# Patient Record
Sex: Female | Born: 1975 | Race: White | Hispanic: No | Marital: Married | Smoking: Former smoker
Health system: Southern US, Community
[De-identification: ages and names within clinical notes are randomized; demographics above are authoritative.]

## PROBLEM LIST (undated history)

## (undated) DIAGNOSIS — M216X9 Other acquired deformities of unspecified foot: Secondary | ICD-10-CM

## (undated) DIAGNOSIS — M2240 Chondromalacia patellae, unspecified knee: Secondary | ICD-10-CM

## (undated) DIAGNOSIS — F32A Depression, unspecified: Secondary | ICD-10-CM

## (undated) DIAGNOSIS — E039 Hypothyroidism, unspecified: Secondary | ICD-10-CM

## (undated) DIAGNOSIS — M069 Rheumatoid arthritis, unspecified: Secondary | ICD-10-CM

## (undated) DIAGNOSIS — Z1509 Genetic susceptibility to other malignant neoplasm: Secondary | ICD-10-CM

## (undated) HISTORY — DX: Genetic susceptibility to other malignant neoplasm: Z15.09

## (undated) HISTORY — DX: Rheumatoid arthritis, unspecified: M06.9

## (undated) HISTORY — DX: Other acquired deformities of unspecified foot: M21.6X9

## (undated) HISTORY — PX: ABDOMINAL HYSTERECTOMY: SHX81

## (undated) HISTORY — DX: Chondromalacia patellae, unspecified knee: M22.40

## (undated) HISTORY — DX: Hypothyroidism, unspecified: E03.9

## (undated) HISTORY — PX: NO PAST SURGERIES: SHX2092

## (undated) HISTORY — DX: Depression, unspecified: F32.A

---

## 2002-03-14 ENCOUNTER — Other Ambulatory Visit: Admission: RE | Admit: 2002-03-14 | Discharge: 2002-03-14 | Payer: Self-pay | Admitting: Obstetrics and Gynecology

## 2008-09-11 ENCOUNTER — Ambulatory Visit: Payer: Self-pay | Admitting: Sports Medicine

## 2008-09-11 DIAGNOSIS — M2240 Chondromalacia patellae, unspecified knee: Secondary | ICD-10-CM | POA: Insufficient documentation

## 2008-09-11 DIAGNOSIS — M216X9 Other acquired deformities of unspecified foot: Secondary | ICD-10-CM

## 2008-12-25 ENCOUNTER — Ambulatory Visit (HOSPITAL_COMMUNITY): Admission: RE | Admit: 2008-12-25 | Discharge: 2008-12-25 | Payer: Self-pay | Admitting: Obstetrics and Gynecology

## 2009-11-08 ENCOUNTER — Inpatient Hospital Stay (HOSPITAL_COMMUNITY): Admission: AD | Admit: 2009-11-08 | Discharge: 2009-11-09 | Payer: Self-pay | Admitting: Obstetrics and Gynecology

## 2009-11-10 ENCOUNTER — Inpatient Hospital Stay (HOSPITAL_COMMUNITY): Admission: AD | Admit: 2009-11-10 | Discharge: 2009-11-13 | Payer: Self-pay | Admitting: Obstetrics and Gynecology

## 2009-12-28 LAB — HM PAP SMEAR: HM Pap smear: NORMAL

## 2010-06-13 LAB — CBC
Hemoglobin: 12.5 g/dL (ref 12.0–15.0)
MCHC: 34.9 g/dL (ref 30.0–36.0)
MCHC: 33.3 g/dL (ref 30.0–36.0)
MCV: 93.7 fL (ref 78.0–100.0)
Platelets: 140 10*3/uL — ABNORMAL LOW (ref 150–400)
Platelets: 188 10*3/uL (ref 150–400)
RDW: 14.1 % (ref 11.5–15.5)
RDW: 13.9 % (ref 11.5–15.5)
WBC: 10 10*3/uL (ref 4.0–10.5)

## 2010-06-13 LAB — RPR: RPR Ser Ql: NONREACTIVE

## 2010-09-19 ENCOUNTER — Ambulatory Visit (INDEPENDENT_AMBULATORY_CARE_PROVIDER_SITE_OTHER): Payer: PRIVATE HEALTH INSURANCE | Admitting: Internal Medicine

## 2010-09-19 ENCOUNTER — Encounter: Payer: Self-pay | Admitting: Internal Medicine

## 2010-09-19 ENCOUNTER — Other Ambulatory Visit (INDEPENDENT_AMBULATORY_CARE_PROVIDER_SITE_OTHER): Payer: PRIVATE HEALTH INSURANCE

## 2010-09-19 ENCOUNTER — Other Ambulatory Visit: Payer: Self-pay | Admitting: Internal Medicine

## 2010-09-19 VITALS — BP 102/70 | HR 53 | Temp 98.6°F | Ht 68.0 in | Wt 164.6 lb

## 2010-09-19 DIAGNOSIS — R1032 Left lower quadrant pain: Secondary | ICD-10-CM

## 2010-09-19 DIAGNOSIS — Z0000 Encounter for general adult medical examination without abnormal findings: Secondary | ICD-10-CM

## 2010-09-19 DIAGNOSIS — E039 Hypothyroidism, unspecified: Secondary | ICD-10-CM

## 2010-09-19 LAB — URINALYSIS
Hgb urine dipstick: NEGATIVE
Ketones, ur: NEGATIVE
Leukocytes, UA: NEGATIVE
Specific Gravity, Urine: 1.015 (ref 1.000–1.030)
Urine Glucose: NEGATIVE
Urobilinogen, UA: 0.2 (ref 0.0–1.0)

## 2010-09-19 LAB — BASIC METABOLIC PANEL
BUN: 11 mg/dL (ref 6–23)
CO2: 25 mEq/L (ref 19–32)
Calcium: 9.1 mg/dL (ref 8.4–10.5)
Chloride: 107 mEq/L (ref 96–112)
Creatinine, Ser: 0.5 mg/dL (ref 0.4–1.2)
Glucose, Bld: 98 mg/dL (ref 70–99)

## 2010-09-19 LAB — LIPID PANEL
LDL Cholesterol: 105 mg/dL — ABNORMAL HIGH (ref 0–99)
Total CHOL/HDL Ratio: 3
Triglycerides: 35 mg/dL (ref 0.0–149.0)

## 2010-09-19 LAB — HEPATIC FUNCTION PANEL
AST: 33 U/L (ref 0–37)
Alkaline Phosphatase: 84 U/L (ref 39–117)
Bilirubin, Direct: 0.6 mg/dL — ABNORMAL HIGH (ref 0.0–0.3)
Total Bilirubin: 1.6 mg/dL — ABNORMAL HIGH (ref 0.3–1.2)

## 2010-09-19 LAB — CBC WITH DIFFERENTIAL/PLATELET
Basophils Absolute: 0 10*3/uL (ref 0.0–0.1)
Basophils Relative: 0.5 % (ref 0.0–3.0)
Eosinophils Absolute: 0.1 10*3/uL (ref 0.0–0.7)
Lymphocytes Relative: 49.8 % — ABNORMAL HIGH (ref 12.0–46.0)
MCHC: 34.5 g/dL (ref 30.0–36.0)
MCV: 90.4 fl (ref 78.0–100.0)
Monocytes Absolute: 0.4 10*3/uL (ref 0.1–1.0)
Neutrophils Relative %: 40.5 % — ABNORMAL LOW (ref 43.0–77.0)
RDW: 14.1 % (ref 11.5–14.6)

## 2010-09-19 NOTE — Patient Instructions (Signed)
It was good to see you today. We have reviewed your prior records today Test(s) ordered today. Your results will be called to you after review (48-72hours after test completion). If any changes need to be made, you will be notified at that time. IF unremarkable labs, will plan [place] to evaluate LLQ pain Please schedule followup in 6 months to follow thyroid, call sooner if problems.

## 2010-09-19 NOTE — Assessment & Plan Note (Signed)
The current medical regimen is effective;  continue present plan and medications. No results found for this basename: TSH

## 2010-09-19 NOTE — Progress Notes (Signed)
Subjective:    Patient ID: Jennifer Beasley, female    DOB: 05/21/1975, 35 y.o.   MRN: [id number]  HPI Here to est care - new to me and our practice - patient is here today for annual physical. Patient feels well today  complains of LLQ pain symptoms intermittent Ongoing >2 mo - occurs most days of week but not daily Not related to bowel movement, activity, position, food Not associated with fever, change bowels, travel or menses No nausea and vomiting  Eval by gyn for same - "no cyst" or gyn problem identified on pelvic exam Also s/p empiric tx macrobid x 7 days for ?UTI - not change pain symptoms   Also reviewed  Chronic med issues: Hypothyroid - dx mid 30s prepregnancy. the patient reports compliance with medication(s) as prescribed. Denies adverse side effects. No unintentional weight changes  Past Medical History  Diagnosis Date  . 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 History of chicken pox   . UTI (urinary tract infection)   . Unspecified hypothyroidism    Family History  Problem Relation Age of Onset  . Hyperlipidemia Mother   . Stroke Mother 22    A fib  . Atrial fibrillation Mother   . Diabetes Mother   . Hypothyroidism Mother   . Ovarian cancer Maternal Aunt   . Breast cancer Maternal Aunt   . Cancer Maternal Aunt 63    breast  . Breast cancer Maternal Grandmother   . Cancer Maternal Grandmother 4    breast  . Prostate cancer Maternal Grandfather   . Alcohol abuse Other     other relative  . Diabetes Other     parent, and paternal grandparents   History  Substance Use Topics  . Smoking status: Former Games developer  . Smokeless tobacco: Not on file  . Alcohol Use: Yes    Review of Systems  Constitutional: Negative for fever.  Respiratory: Negative for cough and shortness of breath.   Cardiovascular: Negative for chest pain.  Gastrointestinal: See HPI above - otherwise negative.  Musculoskeletal:  Negative for gait problem.  Skin: Negative for rash.  Neurological: Negative for dizziness.  No other specific complaints in a complete review of systems (except as listed in HPI above).     Objective:   Physical Exam BP 102/70  Pulse 53  Temp(Src) 98.6 F (37 C) (Oral)  Ht 5\' 8"  (1.727 m)  Wt 164 lb 9.6 oz (74.662 kg)  BMI 25.03 kg/m2  SpO2 99% Physical Exam  Constitutional: She is oriented to person, place, and time. She appears well-developed and well-nourished. No distress.  HENT: Head: Normocephalic and atraumatic. Ears; B TMs ok, no erythema or effusion; Nose: Nose normal.  Mouth/Throat: Oropharynx is clear and moist. No oropharyngeal exudate.  Eyes: Conjunctivae and EOM are normal. Pupils are equal, round, and reactive to light. No scleral icterus.  Neck: Normal range of motion. Neck supple. No JVD present. No thyromegaly present.  Cardiovascular: Normal rate, regular rhythm and normal heart sounds.  No murmur heard. No BLE edema. Pulmonary/Chest: Effort normal and breath sounds normal. No respiratory distress. She has no wheezes.  Abdominal: Soft. Bowel sounds are normal. She exhibits no distension. There is no tenderness.  Musculoskeletal: Normal range of motion, no joint effusions. No gross deformities Neurological: She is alert and oriented to person, place, and time. No cranial nerve deficit. Coordination normal.  Skin: Skin is warm and dry. No rash noted. No erythema.  Psychiatric: She has a normal mood and affect. Her behavior is normal. Judgment and thought content normal.   Lab Results  Component Value Date   WBC 10.0 11/12/2009   HGB 10.2 DELTA CHECK NOTED REPEATED TO VERIFY* 11/12/2009   HCT 29.1* 11/12/2009   PLT 140 DELTA CHECK NOTED SPECIMEN CHECKED FOR CLOTS REPEATED TO VERIFY* 11/12/2009       Assessment & Plan:  CPX - v70.0 - Patient has been counseled on age-appropriate routine health concerns for screening and prevention. These are reviewed and  up-to-date. Immunizations are up-to-date or declined. Labs order and reviewed.  LLQ pain - intermittent but ongoing > 2 mo - review labs as for CPX and pursue [place] if no other abn found on labs

## 2010-09-24 ENCOUNTER — Ambulatory Visit
Admission: RE | Admit: 2010-09-24 | Discharge: 2010-09-24 | Disposition: A | Discharge status: Home / Self Care | Payer: PRIVATE HEALTH INSURANCE | Source: Ambulatory Visit | Attending: Internal Medicine | Admitting: Internal Medicine

## 2010-09-24 DIAGNOSIS — R1032 Left lower quadrant pain: Secondary | ICD-10-CM

## 2010-09-25 ENCOUNTER — Encounter: Payer: Self-pay | Admitting: *Deleted

## 2011-03-31 NOTE — L&D Delivery Note (Signed)
Delivery Note  C/C/+2 at 0032 Onset of active 2nd stage at 0035 Pushing in R lateral position FHR category 1 in 2nd stage  At 12:52 AM a viable female was delivered via Vaginal, Spontaneous Delivery (Presentation: Right Occiput Anterior).  APGAR: 8, 9; weight pending .   Placenta status: Intact, Spontaneous, for disposal.  Cord: 3 vessels with the following complications: None.  Cord clamped after pulsing cessation, cut by FOB. Cord pH: not done  Anesthesia: Epidural  Episiotomy: None Lacerations: 1st degree Suture Repair: 3.0 vicryl rapide Est. Blood Loss (mL): 200  Mom to postpartum.  Baby to mother's arms for bonding.  Jennifer Beasley 11/04/2011, 1:20 AM

## 2011-04-07 LAB — OB RESULTS CONSOLE ABO/RH

## 2011-04-07 LAB — OB RESULTS CONSOLE HIV ANTIBODY (ROUTINE TESTING): HIV: NONREACTIVE

## 2011-04-07 LAB — OB RESULTS CONSOLE RPR: RPR: NONREACTIVE

## 2011-04-07 LAB — OB RESULTS CONSOLE RUBELLA ANTIBODY, IGM: Rubella: IMMUNE

## 2011-10-15 LAB — OB RESULTS CONSOLE GBS: GBS: NEGATIVE

## 2011-11-03 ENCOUNTER — Inpatient Hospital Stay (HOSPITAL_COMMUNITY)
Admission: AD | Admit: 2011-11-03 | Discharge: 2011-11-06 | DRG: 775 | Disposition: A | Discharge status: Home / Self Care | Payer: PRIVATE HEALTH INSURANCE | Source: Ambulatory Visit | Attending: Obstetrics & Gynecology | Admitting: Obstetrics & Gynecology

## 2011-11-03 ENCOUNTER — Encounter (HOSPITAL_COMMUNITY): Payer: Self-pay | Admitting: *Deleted

## 2011-11-03 DIAGNOSIS — M216X9 Other acquired deformities of unspecified foot: Secondary | ICD-10-CM

## 2011-11-03 DIAGNOSIS — E039 Hypothyroidism, unspecified: Secondary | ICD-10-CM | POA: Diagnosis not present

## 2011-11-03 DIAGNOSIS — E079 Disorder of thyroid, unspecified: Secondary | ICD-10-CM | POA: Diagnosis not present

## 2011-11-03 DIAGNOSIS — O09529 Supervision of elderly multigravida, unspecified trimester: Secondary | ICD-10-CM | POA: Diagnosis present

## 2011-11-03 DIAGNOSIS — IMO0001 Reserved for inherently not codable concepts without codable children: Secondary | ICD-10-CM

## 2011-11-03 DIAGNOSIS — M2240 Chondromalacia patellae, unspecified knee: Secondary | ICD-10-CM

## 2011-11-03 LAB — CBC
HCT: 34.5 % — ABNORMAL LOW (ref 36.0–46.0)
Hemoglobin: 11.8 g/dL — ABNORMAL LOW (ref 12.0–15.0)
MCHC: 34.2 g/dL (ref 30.0–36.0)

## 2011-11-03 MED ORDER — LACTATED RINGERS IV SOLN: 500.0000 mL | INTRAVENOUS | Status: DC | PRN

## 2011-11-03 MED ORDER — DIPHENHYDRAMINE HCL 50 MG/ML IJ SOLN: 12.5000 mg | INTRAMUSCULAR | Status: DC | PRN

## 2011-11-03 MED ORDER — PHENYLEPHRINE 40 MCG/ML (10ML) SYRINGE FOR IV PUSH (FOR BLOOD PRESSURE SUPPORT): 80.0000 ug | PREFILLED_SYRINGE | INTRAVENOUS | Status: DC | PRN

## 2011-11-03 MED ORDER — FLEET ENEMA 7-19 GM/118ML RE ENEM: 1.0000 | ENEMA | RECTAL | Status: DC | PRN

## 2011-11-03 MED ORDER — OXYTOCIN 40 UNITS IN LACTATED RINGERS INFUSION - SIMPLE MED
62.5000 mL/h | Freq: Once | INTRAVENOUS | Status: AC
  Administered 2011-11-04: 62.5 mL/h via INTRAVENOUS
  Filled 2011-11-03: qty 1000

## 2011-11-03 MED ORDER — OXYTOCIN BOLUS FROM INFUSION
250.0000 mL | Freq: Once | INTRAVENOUS | Status: AC
  Administered 2011-11-04: 250 mL via INTRAVENOUS
  Filled 2011-11-03: qty 500

## 2011-11-03 MED ORDER — LACTATED RINGERS IV SOLN
500.0000 mL | Freq: Once | INTRAVENOUS | Status: AC
  Administered 2011-11-03: 500 mL via INTRAVENOUS

## 2011-11-03 MED ORDER — LIDOCAINE HCL (PF) 1 % IJ SOLN
30.0000 mL | INTRAMUSCULAR | Status: DC | PRN
  Filled 2011-11-03: qty 30

## 2011-11-03 MED ORDER — ACETAMINOPHEN 325 MG PO TABS: 650.0000 mg | ORAL_TABLET | ORAL | Status: DC | PRN

## 2011-11-03 MED ORDER — OXYTOCIN 10 UNIT/ML IJ SOLN: 10.0000 [IU] | Freq: Once | INTRAMUSCULAR | Status: DC

## 2011-11-03 MED ORDER — EPHEDRINE 5 MG/ML INJ
10.0000 mg | INTRAVENOUS | Status: DC | PRN
Start: 2011-11-03 — End: 2011-11-04

## 2011-11-03 MED ORDER — IBUPROFEN 600 MG PO TABS: 600.0000 mg | ORAL_TABLET | Freq: Four times a day (QID) | ORAL | Status: DC | PRN

## 2011-11-03 MED ORDER — ONDANSETRON HCL 4 MG/2ML IJ SOLN
4.0000 mg | Freq: Four times a day (QID) | INTRAMUSCULAR | Status: DC | PRN
  Administered 2011-11-04: 4 mg via INTRAVENOUS
  Filled 2011-11-03: qty 2

## 2011-11-03 MED ORDER — PHENYLEPHRINE 40 MCG/ML (10ML) SYRINGE FOR IV PUSH (FOR BLOOD PRESSURE SUPPORT)
80.0000 ug | PREFILLED_SYRINGE | INTRAVENOUS | Status: DC | PRN
Start: 2011-11-03 — End: 2011-11-04
  Filled 2011-11-03: qty 5

## 2011-11-03 MED ORDER — EPHEDRINE 5 MG/ML INJ
10.0000 mg | INTRAVENOUS | Status: DC | PRN
Start: 2011-11-03 — End: 2011-11-04
  Filled 2011-11-03: qty 4

## 2011-11-03 MED ORDER — CITRIC ACID-SODIUM CITRATE 334-500 MG/5ML PO SOLN: 30.0000 mL | ORAL | Status: DC | PRN

## 2011-11-03 MED ORDER — OXYCODONE-ACETAMINOPHEN 5-325 MG PO TABS: 1.0000 | ORAL_TABLET | ORAL | Status: DC | PRN

## 2011-11-03 MED ORDER — FENTANYL 2.5 MCG/ML BUPIVACAINE 1/10 % EPIDURAL INFUSION (WH - ANES)
14.0000 mL/h | INTRAMUSCULAR | Status: DC
  Administered 2011-11-04: 14 mL/h via EPIDURAL
  Filled 2011-11-03: qty 60

## 2011-11-03 NOTE — MAU Note (Signed)
Pt states, " I am in labor, and contractions are eery three min."

## 2011-11-03 NOTE — MAU Note (Signed)
Arlan Organ CNM at bedside

## 2011-11-03 NOTE — H&P (Signed)
Jennifer Beasley is a 36 y.o., G2P1001 at [redacted]w[redacted]d female presenting for regular strong ctx. . Maternal Medical History:  Reason for admission: Reason for admission: contractions.  Patient seen in office this AM, was 3/80/-2 with irregular contractions. Has continued to contract at home since, increased frequency and strength for past 3 hours. Reports light spotting / mucus DC since this AM. Denies LOF.  Contractions: Frequency: regular.   Duration is approximately 60 seconds.   Perceived severity is moderate.    Fetal activity: Perceived fetal activity is normal.   Last perceived fetal movement was within the past hour.    Prenatal complications: no prenatal complications Hypothyroidism well controlled on synthroid.     OB History    Grav Para Term Preterm Abortions TAB SAB Ect Mult Living                 Past Medical History  Diagnosis Date  . 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 History of chicken pox   . UTI (urinary tract infection)   . Unspecified hypothyroidism    Past Surgical History  Procedure Date  . No past surgeries    Family History: family history includes Alcohol abuse in her other; Atrial fibrillation in her mother; Breast cancer in her maternal aunt and maternal grandmother; Cancer (age of onset:42) in her maternal grandmother; Cancer (age of onset:63) in her maternal aunt; Diabetes in her mother and other; Hyperlipidemia in her mother; Hypothyroidism in her mother; Ovarian cancer in her maternal aunt; Prostate cancer in her maternal grandfather; and Stroke (age of onset:47) in her mother. Social History:  reports that she has quit smoking. She does not have any smokeless tobacco history on file. She reports that she drinks alcohol. She reports that she does not use illicit drugs.   Prenatal Transfer Tool  Maternal Diabetes: No Genetic Screening: Normal Maternal Ultrasounds/Referrals: Normal Fetal  Ultrasounds or other Referrals:  None Maternal Substance Abuse:  No Significant Maternal Medications:  Meds include: Syntroid Significant Maternal Lab Results:  Lab values include: Group B Strep negative Other Comments:  None  Review of Systems  Constitutional:       Negative. Earlier nausea resolved w/ Zofran x 1 dose.      There were no vitals taken for this visit. Maternal Exam:  Uterine Assessment: Contraction strength is moderate.  Contraction frequency is regular.   Abdomen: Fundal height is S=D.   Estimated fetal weight is 7-7.5.   Fetal presentation: vertex  Introitus: Normal vulva. Normal vagina.  Pelvis: adequate for delivery.   Cervix: Cervix evaluated by digital exam.   7/90/0 BBOW    Fetal Exam Fetal Monitor Review: Mode: ultrasound.   Baseline rate: 130.  Variability: moderate (6-25 bpm).   Pattern: accelerations present and no decelerations.    Fetal State Assessment: Category I - tracings are normal.     Physical Exam  Nursing note and vitals reviewed. Constitutional: She is oriented to person, place, and time. She appears well-developed and well-nourished.  HENT:  Head: Normocephalic.  Eyes: Pupils are equal, round, and reactive to light.  Neck: Normal range of motion. Neck supple.  Cardiovascular: Normal rate and regular rhythm.   Respiratory: Effort normal and breath sounds normal.  GI: Soft. Bowel sounds are normal. There is no tenderness.  Genitourinary: Vagina normal.  Musculoskeletal: Normal range of motion.  Neurological: She is alert and oriented to person, place, and time. She has normal reflexes.  Skin:  Skin is warm and dry.  Psychiatric: She has a normal mood and affect.    Prenatal labs: ABO, Rh:  A  pos Antibody:  neg Rubella:  immune RPR:   NR HBsAg:   NR HIV:   neg GBS:   neg 3GTT: wnl [place]: nl anatomy Genetic screens wnl TSH 1.56 at 28 wks  Assessment/Plan: IUP at term, spontaneous labor Hypothyroid well controlled  on synthroid GBS neg  Admit to [facility] Expectant management Epidural now Plan AROM PRN  Consult Dr. Reyne Dumas 11/03/2011, 11:06 PM

## 2011-11-04 ENCOUNTER — Encounter (HOSPITAL_COMMUNITY): Payer: Self-pay

## 2011-11-04 ENCOUNTER — Encounter (HOSPITAL_COMMUNITY): Payer: Self-pay | Admitting: Anesthesiology

## 2011-11-04 ENCOUNTER — Inpatient Hospital Stay (HOSPITAL_COMMUNITY): Payer: PRIVATE HEALTH INSURANCE | Admitting: Anesthesiology

## 2011-11-04 LAB — CBC
Hemoglobin: 11.8 g/dL — ABNORMAL LOW (ref 12.0–15.0)
MCH: 30.5 pg (ref 26.0–34.0)
Platelets: 179 10*3/uL (ref 150–400)
RBC: 3.87 MIL/uL (ref 3.87–5.11)
WBC: 10.5 10*3/uL (ref 4.0–10.5)

## 2011-11-04 MED ORDER — LEVOTHYROXINE SODIUM 50 MCG PO TABS
50.0000 ug | ORAL_TABLET | Freq: Every day | ORAL | Status: DC
  Administered 2011-11-04 – 2011-11-06 (×3): 50 ug via ORAL
  Filled 2011-11-04 (×3): qty 1

## 2011-11-04 MED ORDER — ONDANSETRON HCL 4 MG/2ML IJ SOLN: 4.0000 mg | INTRAMUSCULAR | Status: DC | PRN

## 2011-11-04 MED ORDER — OXYCODONE-ACETAMINOPHEN 5-325 MG PO TABS: 1.0000 | ORAL_TABLET | ORAL | Status: DC | PRN

## 2011-11-04 MED ORDER — ONDANSETRON HCL 4 MG PO TABS: 4.0000 mg | ORAL_TABLET | ORAL | Status: DC | PRN

## 2011-11-04 MED ORDER — LANOLIN HYDROUS EX OINT: TOPICAL_OINTMENT | CUTANEOUS | Status: DC | PRN

## 2011-11-04 MED ORDER — IBUPROFEN 600 MG PO TABS
600.0000 mg | ORAL_TABLET | Freq: Four times a day (QID) | ORAL | Status: DC
  Administered 2011-11-04 – 2011-11-06 (×9): 600 mg via ORAL
  Filled 2011-11-04 (×8): qty 1

## 2011-11-04 MED ORDER — ZOLPIDEM TARTRATE 5 MG PO TABS: 5.0000 mg | ORAL_TABLET | Freq: Every evening | ORAL | Status: DC | PRN

## 2011-11-04 MED ORDER — SIMETHICONE 80 MG PO CHEW
80.0000 mg | CHEWABLE_TABLET | ORAL | Status: DC | PRN
  Administered 2011-11-04: 80 mg via ORAL

## 2011-11-04 MED ORDER — BENZOCAINE-MENTHOL 20-0.5 % EX AERO
1.0000 "application " | INHALATION_SPRAY | CUTANEOUS | Status: DC | PRN
  Administered 2011-11-04: 1 via TOPICAL
  Filled 2011-11-04: qty 56

## 2011-11-04 MED ORDER — WITCH HAZEL-GLYCERIN EX PADS: 1.0000 "application " | MEDICATED_PAD | CUTANEOUS | Status: DC | PRN

## 2011-11-04 MED ORDER — PRENATAL MULTIVITAMIN CH
1.0000 | ORAL_TABLET | Freq: Every day | ORAL | Status: DC
  Administered 2011-11-04 – 2011-11-06 (×3): 1 via ORAL
  Filled 2011-11-04 (×3): qty 1

## 2011-11-04 MED ORDER — DIPHENHYDRAMINE HCL 25 MG PO CAPS: 25.0000 mg | ORAL_CAPSULE | Freq: Four times a day (QID) | ORAL | Status: DC | PRN

## 2011-11-04 MED ORDER — LIDOCAINE HCL (PF) 1 % IJ SOLN
INTRAMUSCULAR | Status: DC | PRN
  Administered 2011-11-04 (×4): 4 mL

## 2011-11-04 MED ORDER — TETANUS-DIPHTH-ACELL PERTUSSIS 5-2.5-18.5 LF-MCG/0.5 IM SUSP: 0.5000 mL | Freq: Once | INTRAMUSCULAR | Status: DC

## 2011-11-04 MED ORDER — DIBUCAINE 1 % RE OINT: 1.0000 "application " | TOPICAL_OINTMENT | RECTAL | Status: DC | PRN

## 2011-11-04 MED ORDER — SENNOSIDES-DOCUSATE SODIUM 8.6-50 MG PO TABS
2.0000 | ORAL_TABLET | Freq: Every day | ORAL | Status: DC
  Administered 2011-11-04 – 2011-11-05 (×2): 2 via ORAL

## 2011-11-04 MED ORDER — FLEET ENEMA 7-19 GM/118ML RE ENEM: 1.0000 | ENEMA | Freq: Every day | RECTAL | Status: DC | PRN

## 2011-11-04 MED ORDER — BISACODYL 10 MG RE SUPP: 10.0000 mg | Freq: Every day | RECTAL | Status: DC | PRN

## 2011-11-04 NOTE — Anesthesia Procedure Notes (Signed)
Epidural Patient location during procedure: OB Start time: 11/04/2011 12:07 AM Reason for block: procedure for pain  Staffing Performed by: anesthesiologist   Preanesthetic Checklist Completed: patient identified, site marked, surgical consent, pre-op evaluation, timeout performed, IV checked, risks and benefits discussed and monitors and equipment checked  Epidural Patient position: sitting Prep: site prepped and draped and DuraPrep Patient monitoring: continuous pulse ox and blood pressure Approach: midline Injection technique: LOR air  Needle:  Needle type: Tuohy  Needle gauge: 17 G Needle length: 9 cm Needle insertion depth: 6 cm Catheter type: closed end flexible Catheter size: 19 Gauge Catheter at skin depth: 11 cm Test dose: negative  Assessment Events: blood not aspirated, injection not painful, no injection resistance, negative IV test and no paresthesia  Additional Notes Discussed risk of headache, infection, bleeding, nerve injury and failed or incomplete block.  Patient voices understanding and wishes to proceed.

## 2011-11-04 NOTE — Anesthesia Preprocedure Evaluation (Signed)
Anesthesia Evaluation  Patient identified by MRN, date of birth, ID band Patient awake    Reviewed: Allergy & Precautions, H&P , NPO status , Patient's Chart, lab work & pertinent test results, reviewed documented beta blocker date and time   History of Anesthesia Complications Negative for: history of anesthetic complications  Airway Mallampati: I TM Distance: >3 FB Neck ROM: full    Dental  (+) Teeth Intact   Pulmonary neg pulmonary ROS,  breath sounds clear to auscultation        Cardiovascular negative cardio ROS  Rhythm:regular Rate:Normal     Neuro/Psych negative neurological ROS  negative psych ROS   GI/Hepatic negative GI ROS, Neg liver ROS,   Endo/Other  Hypothyroidism   Renal/GU negative Renal ROS     Musculoskeletal   Abdominal   Peds  Hematology negative hematology ROS (+)   Anesthesia Other Findings   Reproductive/Obstetrics (+) Pregnancy                           Anesthesia Physical Anesthesia Plan  ASA: II  Anesthesia Plan: Epidural   Post-op Pain Management:    Induction:   Airway Management Planned:   Additional Equipment:   Intra-op Plan:   Post-operative Plan:   Informed Consent: I have reviewed the patients History and Physical, chart, labs and discussed the procedure including the risks, benefits and alternatives for the proposed anesthesia with the patient or authorized representative who has indicated his/her understanding and acceptance.     Plan Discussed with:   Anesthesia Plan Comments:         Anesthesia Quick Evaluation

## 2011-11-04 NOTE — Progress Notes (Signed)
Jennifer Beasley is a 36 y.o. G2P1001 at [redacted]w[redacted]d by LMP admitted for active labor  Subjective: Comfortable after epidural, reports + rectal pressure w/ ctx.  Objective: Filed Vitals:   11/04/11 0022 11/04/11 0026 11/04/11 0027 11/04/11 0031  BP:  100/63  113/62  Pulse: 57 57 58 60  Temp:      TempSrc:      Resp:    18  Height:      Weight:      SpO2: 100%  100%           FHT:  FHR: 130 bpm, variability: moderate,  accelerations:  Present,  decelerations:  Absent UC:   regular, every 2-3 minutes SVE:   Dilation: 10 Effacement (%): 90 Station: +2 Exam by:: [name]  Labs:   Basename 11/03/11 2330  WBC 8.5  HGB 11.8*  HCT 34.5*  PLT 192    Assessment / Plan: Spontaneous labor, progressing normally  Labor: Progressing normally Preeclampsia:  NA Fetal Wellbeing:  Category I Pain Control:  Epidural I/D:  GBS neg Anticipated MOD:  NSVD  [name] 11/04/2011, 12:34 AM

## 2011-11-04 NOTE — H&P (Signed)
Reviewed and agree with note. Pt preferring CNM at delivery, will continue to assist as needed.  --V.Juliene Pina, MD

## 2011-11-04 NOTE — Anesthesia Postprocedure Evaluation (Signed)
  Anesthesia Post-op Note  Patient: Jennifer Beasley  Procedure(s) Performed: * No procedures listed *  Patient Location: PACU and Mother/Baby  Anesthesia Type: Epidural  Level of Consciousness: awake, alert  and oriented  Airway and Oxygen Therapy: Patient Spontanous Breathing  Post-op Pain: none  Post-op Assessment: Post-op Vital signs reviewed  Post-op Vital Signs: Reviewed and stable  Complications: No apparent anesthesia complications

## 2011-11-05 NOTE — Progress Notes (Signed)
PPD 1 SVD  S:  Reports feeling well, feels constipated. Perineal pressure.               Tolerating po/ No nausea or vomiting             Bleeding is light             Pain controlled with Motrin             Up ad lib / ambulatory / voiding well   Newborn  Information for the patient's newborn:  Zurii, Hewes [[id number]]  female  breast feeding well   O:  A & O x 3 NAD             VS:  Filed Vitals:   11/04/11 0746 11/04/11 1717 11/04/11 2155 11/05/11 0520  BP: 118/67 105/62 110/71 99/63  Pulse: 52 64 62 60  Temp: 98.1 F (36.7 C) 98.3 F (36.8 C) 98.1 F (36.7 C) 98.5 F (36.9 C)  TempSrc: Oral Oral Oral Oral  Resp: 18 20 18 18   Height:      Weight:      SpO2:    97%    LABS:  Basename 11/04/11 0515 11/03/11 2330  WBC 10.5 8.5  HGB 11.8* 11.8*  HCT 34.3* 34.5*  PLT 179 192    Blood type: A/Positive/-- (01/08 0000)  Rubella: Immune (01/08 0000)   I&O: I/O last 3 completed shifts: In: -  Out: 200 [Blood:200]      Lungs: Clear and unlabored  Heart: regular rate and rhythm / no murmurs  Abdomen: soft, non-tender, non-distended              Fundus: firm, non-tender, U -2  Perineum: repair intact, no edema  Lochia: scant  Extremities: no edema, no calf pain or tenderness, neg Homans    A/P: PPD # 1 36 y.o., [id number]    Principal Problem:  *Postpartum care following vaginal delivery (8/7) Active Problems:  Unspecified hypothyroidism  Continue synthroid as previous  Doing well - stable status  Routine post partum orders  Encouraged sitz bath for perineal pressure  Push oral hydration and fiber, colace daily  Anticipate discharge home in AM.   Yakima Kreitzer, CNM, MSN 11/05/2011, 12:30 PM

## 2011-11-06 NOTE — Discharge Summary (Signed)
Obstetric Discharge Summary Reason for Admission: onset of labor and 38w 1d/Hx hypothyroidism Prenatal Procedures: NST and ultrasound Intrapartum Procedures: spontaneous vaginal delivery Postpartum Procedures: none Complications-Operative and Postpartum: 1st degree perineal laceration Hemoglobin  Date Value Range Status  11/04/2011 11.8* 12.0 - 15.0 g/dL Final     HCT  Date Value Range Status  11/04/2011 34.3* 36.0 - 46.0 % Final    Physical Exam:  General: alert, cooperative and no distress Lochia: appropriate Uterine Fundus: firm Incision: n/a DVT Evaluation: No evidence of DVT seen on physical exam.  Discharge Diagnoses: Term Pregnancy-delivered  Discharge Information: Date: 11/06/2011 Activity: pelvic rest Diet: routine Medications: PNV and Ibuprofen Condition: stable Instructions: refer to practice specific booklet Discharge to: home   Newborn Data: Live born female on 11/04/11 Birth Weight: 7 lb 1.8 oz (3225 g) APGAR: , 9  Home with mother.  Jennifer Beasley 11/06/2011, 9:15 AM

## 2011-11-06 NOTE — Discharge Summary (Signed)
Reviewed and agree with note V.Jenaro Souder, MD 

## 2011-11-06 NOTE — Progress Notes (Signed)
Patient ID: Jennifer Beasley, female   DOB: 05-04-1975, 36 y.o.   MRN: [id number] PPD # 2  Subjective: Pt reports feeling well and eager for d/c home/ Pain controlled with prescription NSAID's including motrin Tolerating po/ Voiding without problems/ No n/v Bleeding is light/ Newborn info:  Information for the patient's newborn:  Haeley, Fordham [[id number]]  female Feeding: breast    Objective:  VS: Blood pressure 111/78, pulse 54, temperature 97.5 F (36.4 C), temperature source Oral, resp. rate 18.    Basename 11/04/11 0515 11/03/11 2330  WBC 10.5 8.5  HGB 11.8* 11.8*  HCT 34.3* 34.5*  PLT 179 192    Blood type: A/Positive/-- (01/08 0000) Rubella: Immune (01/08 0000)    Physical Exam:  General: A & O x 3  alert, cooperative and no distress CV: Regular rate and rhythm Resp: clear Abdomen: soft, nontender, normal bowel sounds Uterine Fundus: firm, below umbilicus, nontender Perineum: not inspected Ext: edema trace and Homans sign is negative, no sign of DVT    A/P: PPD # 2/ G2P2002/ S/P:spontaneous vaginal delivery, with 1st degree repair Doing well and stable for discharge home OTC Ibuprofen 600mg  po Q 6 hrs prn pain #30 Refill x 1 WOB/GYN booklet given Routine pp visit in 6wks   Demetrius Revel, MSN, [facility] 11/06/2011, 8:58 AM

## 2011-11-12 ENCOUNTER — Encounter (HOSPITAL_COMMUNITY): Payer: Self-pay | Admitting: *Deleted

## 2012-09-27 ENCOUNTER — Emergency Department (HOSPITAL_COMMUNITY)
Admission: EM | Admit: 2012-09-27 | Discharge: 2012-09-27 | Disposition: A | Discharge status: Home / Self Care | Payer: PRIVATE HEALTH INSURANCE | Attending: Emergency Medicine | Admitting: Emergency Medicine

## 2012-09-27 ENCOUNTER — Emergency Department (HOSPITAL_COMMUNITY): Payer: PRIVATE HEALTH INSURANCE

## 2012-09-27 DIAGNOSIS — Z87440 Personal history of urinary (tract) infections: Secondary | ICD-10-CM | POA: Insufficient documentation

## 2012-09-27 DIAGNOSIS — Z8619 Personal history of other infectious and parasitic diseases: Secondary | ICD-10-CM | POA: Insufficient documentation

## 2012-09-27 DIAGNOSIS — E039 Hypothyroidism, unspecified: Secondary | ICD-10-CM | POA: Insufficient documentation

## 2012-09-27 DIAGNOSIS — Z8739 Personal history of other diseases of the musculoskeletal system and connective tissue: Secondary | ICD-10-CM | POA: Insufficient documentation

## 2012-09-27 DIAGNOSIS — Z79899 Other long term (current) drug therapy: Secondary | ICD-10-CM | POA: Insufficient documentation

## 2012-09-27 DIAGNOSIS — Z87891 Personal history of nicotine dependence: Secondary | ICD-10-CM | POA: Insufficient documentation

## 2012-09-27 DIAGNOSIS — R109 Unspecified abdominal pain: Secondary | ICD-10-CM | POA: Insufficient documentation

## 2012-09-27 DIAGNOSIS — R630 Anorexia: Secondary | ICD-10-CM | POA: Insufficient documentation

## 2012-09-27 DIAGNOSIS — R110 Nausea: Secondary | ICD-10-CM | POA: Insufficient documentation

## 2012-09-27 LAB — CBC WITH DIFFERENTIAL/PLATELET
Basophils Absolute: 0 10*3/uL (ref 0.0–0.1)
Eosinophils Relative: 1 % (ref 0–5)
HCT: 38.6 % (ref 36.0–46.0)
Hemoglobin: 12.9 g/dL (ref 12.0–15.0)
Lymphocytes Relative: 48 % — ABNORMAL HIGH (ref 12–46)
Lymphs Abs: 3.3 10*3/uL (ref 0.7–4.0)
MCV: 87.7 fL (ref 78.0–100.0)
Monocytes Absolute: 0.4 10*3/uL (ref 0.1–1.0)
Neutro Abs: 3.1 10*3/uL (ref 1.7–7.7)
RBC: 4.4 MIL/uL (ref 3.87–5.11)
WBC: 6.8 10*3/uL (ref 4.0–10.5)

## 2012-09-27 LAB — WET PREP, GENITAL: Clue Cells Wet Prep HPF POC: NONE SEEN

## 2012-09-27 LAB — URINALYSIS, ROUTINE W REFLEX MICROSCOPIC
Bilirubin Urine: NEGATIVE
Ketones, ur: NEGATIVE mg/dL
Leukocytes, UA: NEGATIVE
Nitrite: NEGATIVE
Specific Gravity, Urine: 1.014 (ref 1.005–1.030)
Urobilinogen, UA: 0.2 mg/dL (ref 0.0–1.0)
pH: 5 (ref 5.0–8.0)

## 2012-09-27 MED ORDER — OXYCODONE-ACETAMINOPHEN 5-325 MG PO TABS: 1.0000 | ORAL_TABLET | Freq: Four times a day (QID) | ORAL | Status: DC | PRN

## 2012-09-27 MED ORDER — SODIUM CHLORIDE 0.9 % IV SOLN
Freq: Once | INTRAVENOUS | Status: AC
  Administered 2012-09-27: 22:00:00 via INTRAVENOUS

## 2012-09-27 MED ORDER — IOHEXOL 300 MG/ML  SOLN
50.0000 mL | Freq: Once | INTRAMUSCULAR | Status: AC | PRN
  Administered 2012-09-27: 50 mL via ORAL

## 2012-09-27 MED ORDER — IOHEXOL 300 MG/ML  SOLN
100.0000 mL | Freq: Once | INTRAMUSCULAR | Status: AC | PRN
  Administered 2012-09-27: 100 mL via INTRAVENOUS

## 2012-09-27 MED ORDER — ONDANSETRON HCL 4 MG/2ML IJ SOLN
4.0000 mg | Freq: Once | INTRAMUSCULAR | Status: AC
  Administered 2012-09-27: 4 mg via INTRAVENOUS
  Filled 2012-09-27: qty 2

## 2012-09-27 NOTE — ED Notes (Signed)
Pt has been having RLQ pain x 2 days. Worsening today. Nausea. Denies diarrhea. Has appendix.

## 2012-09-27 NOTE — ED Provider Notes (Signed)
History    CSN: [id number] Arrival date & time 09/27/12  2122  First MD Initiated Contact with Patient 09/27/12 2124     Chief Complaint  Patient presents with  . Abdominal Pain   (Consider location/radiation/quality/duration/timing/severity/associated sxs/prior Treatment) Patient is a 37 y.o. female presenting with abdominal pain. The history is provided by the patient.  Abdominal Pain This is a new problem. The current episode started yesterday. The problem occurs constantly. The problem has been gradually worsening. Associated symptoms include abdominal pain, anorexia and nausea. Pertinent negatives include no chest pain, coughing, fever, urinary symptoms, vomiting or weakness. The symptoms are aggravated by exertion. The treatment provided no relief.   Past Medical History  Diagnosis Date  . 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 History of chicken pox   . UTI (urinary tract infection)   . Unspecified hypothyroidism   . Active labor 11/03/2011  . Postpartum care following vaginal delivery (8/7) 11/04/2011   Past Surgical History  Procedure Laterality Date  . No past surgeries     Family History  Problem Relation Age of Onset  . Hyperlipidemia Mother   . Stroke Mother 48    A fib  . Atrial fibrillation Mother   . Diabetes Mother   . Hypothyroidism Mother   . Ovarian cancer Maternal Aunt   . Breast cancer Maternal Aunt   . Cancer Maternal Aunt 63    breast  . Breast cancer Maternal Grandmother   . Cancer Maternal Grandmother 66    breast  . Prostate cancer Maternal Grandfather   . Alcohol abuse Other     other relative  . Diabetes Other     parent, and paternal grandparents  . Other Neg Hx    History  Substance Use Topics  . Smoking status: Former Smoker    Quit date: 04/19/2011  . Smokeless tobacco: Never Used  . Alcohol Use: Yes   OB History   Grav Para Term Preterm Abortions TAB SAB Ect Mult Living   2 2  2       2      Review of Systems  Constitutional: Negative for fever.  Respiratory: Negative for cough and shortness of breath.   Cardiovascular: Negative for chest pain.  Gastrointestinal: Positive for nausea, abdominal pain and anorexia. Negative for vomiting, diarrhea and constipation.  Genitourinary: Negative for dysuria, flank pain, vaginal bleeding, vaginal discharge, menstrual problem and pelvic pain.  Musculoskeletal: Negative for back pain.  Skin: Negative for wound.  Neurological: Negative for weakness.  All other systems reviewed and are negative.    Allergies  Review of patient's allergies indicates no known allergies.  Home Medications   Current Outpatient Rx  Name  Route  Sig  Dispense  Refill  . levothyroxine (SYNTHROID, LEVOTHROID) 50 MCG tablet   Oral   Take 50 mcg by mouth daily.           . ondansetron (ZOFRAN-ODT) 8 MG disintegrating tablet   Oral   Take 8 mg by mouth every 8 (eight) hours as needed for nausea.         . sertraline (ZOLOFT) 50 MG tablet   Oral   Take 50 mg by mouth daily.         Marland Kitchen oxyCODONE-acetaminophen (PERCOCET/ROXICET) 5-325 MG per tablet   Oral   Take 1-2 tablets by mouth every 6 (six) hours as needed for pain.   30 tablet   0    BP  133/79  Pulse 74  Temp(Src) 98.3 F (36.8 C) (Oral)  SpO2 100% Physical Exam  Constitutional: She appears well-developed and well-nourished.  HENT:  Head: Normocephalic.  Eyes: Pupils are equal, round, and reactive to light.  Neck: Normal range of motion.  Cardiovascular: Normal rate and regular rhythm.   Pulmonary/Chest: Effort normal and breath sounds normal.  Abdominal: Soft. Bowel sounds are normal. She exhibits no distension. There is no hepatosplenomegaly. There is tenderness in the right lower quadrant and suprapubic area. There is no rigidity, no rebound and no guarding.    Genitourinary: Vagina normal. No tenderness around the vagina. No vaginal discharge found.    Lymphadenopathy:       Right: No inguinal adenopathy present.       Left: No inguinal adenopathy present.    ED Course  Procedures (including critical care time) Labs Reviewed  WET PREP, GENITAL - Abnormal; Notable for the following:    WBC, Wet Prep HPF POC MODERATE (*)    All other components within normal limits  CBC WITH DIFFERENTIAL - Abnormal; Notable for the following:    Lymphocytes Relative 48 (*)    All other components within normal limits  POCT I-STAT, CHEM 8 - Abnormal; Notable for the following:    Sodium 134 (*)    Potassium 7.1 (*)    Calcium, Ion 1.01 (*)    All other components within normal limits  URINALYSIS, ROUTINE W REFLEX MICROSCOPIC  POTASSIUM   Ct Abdomen Pelvis W Contrast  09/27/2012   *RADIOLOGY REPORT*  Clinical Data: Quadrant pain for 2 days, worsening.  Nausea.  CT ABDOMEN AND PELVIS WITH CONTRAST  Technique:  Multidetector CT imaging of the abdomen and pelvis was performed following the standard protocol during bolus administration of intravenous contrast.  Contrast:100 ml Omnipaque-300.  Comparison: None.  Findings: Lung bases are clear.  No pleural or pericardial effusion.  The gallbladder, liver, spleen, adrenal glands, pancreas and kidneys appear normal.  The appendix is well visualized and normal in appearance.  The stomach and small and large bowel appear normal.  There is no lymphadenopathy or fluid.  IUD is in place in the uterus.  Adnexa are unremarkable.  No focal bony abnormality is identified.  IMPRESSION: Negative for appendicitis or other acute abnormality.  Negative exam.   Original Report Authenticated By: Holley Dexter, M.D.   1. Abdominal pain     MDM   CT scan, urine, are all within normal limits.  Patient was informed to continue, a prescription for pain control, as she will be traveling for the next month.  She knows to followup as needed  Arman Filter, NP 09/27/12 2308

## 2012-09-27 NOTE — ED Notes (Signed)
PA made aware of i-stat results.

## 2012-09-28 LAB — POCT I-STAT, CHEM 8
Calcium, Ion: 1.01 mmol/L — ABNORMAL LOW (ref 1.12–1.23)
Chloride: 104 mEq/L (ref 96–112)
Glucose, Bld: 86 mg/dL (ref 70–99)
HCT: 41 % (ref 36.0–46.0)
Hemoglobin: 13.9 g/dL (ref 12.0–15.0)
Potassium: 7.1 mEq/L (ref 3.5–5.1)

## 2012-09-28 NOTE — ED Provider Notes (Signed)
Medical screening examination/treatment/procedure(s) were performed by non-physician practitioner and as supervising physician I was immediately available for consultation/collaboration.  Cortez Flippen, MD 09/28/12 0043 

## 2012-11-23 ENCOUNTER — Encounter: Payer: Self-pay | Admitting: Internal Medicine

## 2012-11-23 ENCOUNTER — Ambulatory Visit (INDEPENDENT_AMBULATORY_CARE_PROVIDER_SITE_OTHER): Payer: PRIVATE HEALTH INSURANCE | Admitting: Internal Medicine

## 2012-11-23 VITALS — BP 102/72 | HR 65 | Temp 97.1°F | Ht 68.0 in | Wt 185.4 lb

## 2012-11-23 DIAGNOSIS — F329 Major depressive disorder, single episode, unspecified: Secondary | ICD-10-CM

## 2012-11-23 DIAGNOSIS — F53 Postpartum depression: Secondary | ICD-10-CM | POA: Insufficient documentation

## 2012-11-23 DIAGNOSIS — Z0000 Encounter for general adult medical examination without abnormal findings: Secondary | ICD-10-CM

## 2012-11-23 DIAGNOSIS — E039 Hypothyroidism, unspecified: Secondary | ICD-10-CM

## 2012-11-23 DIAGNOSIS — O99345 Other mental disorders complicating the puerperium: Secondary | ICD-10-CM

## 2012-11-23 MED ORDER — LEVOTHYROXINE SODIUM 75 MCG PO TABS: 75.0000 ug | ORAL_TABLET | Freq: Every day | ORAL | Status: DC

## 2012-11-23 MED ORDER — SERTRALINE HCL 50 MG PO TABS: 50.0000 mg | ORAL_TABLET | Freq: Every day | ORAL | Status: DC

## 2012-11-23 NOTE — Assessment & Plan Note (Signed)
Last dose change spring 2014 reviewed Check now, adjust as needed Lab Results  Component Value Date   TSH 1.89 09/19/2010

## 2012-11-23 NOTE — Progress Notes (Signed)
Subjective:    Patient ID: Jennifer Beasley, female    DOB: Sep 04, 1975, 37 y.o.   MRN: [id number]  HPI  patient is here today for annual physical. Patient feels well today  Reviewed chronic medical issues and interval medical events: Hypothyroidism - status change winter 2014 following second pregnancy -denies side effects to current medications. Denies unexpected weight change, hair change or skin change  Postpartum depression. Began on moderate dose sertraline spring 2014 under direction gynecologist. Symptoms have markedly improved. Denies irritability, tearfulness, dysphoric symptoms - questions plans for eventual wean off same   Past Medical History  Diagnosis Date  . Cavus deformity of foot, acquired   . CHONDROMALACIA PATELLA, LEFT   . Unspecified hypothyroidism    Family History  Problem Relation Age of Onset  . Hyperlipidemia Mother   . Stroke Mother 2    A fib  . Atrial fibrillation Mother   . Diabetes Mother   . Hypothyroidism Mother   . Ovarian cancer Maternal Aunt   . Breast cancer Maternal Aunt 63  . Breast cancer Maternal Grandmother   . Cancer Maternal Grandmother 77    breast  . Prostate cancer Maternal Grandfather   . Alcohol abuse Other     other relatives  . Diabetes Paternal Grandmother    History  Substance Use Topics  . Smoking status: Former Smoker    Quit date: 04/19/2011  . Smokeless tobacco: Never Used  . Alcohol Use: Yes    Review of Systems  Constitutional: Negative for fever.  Respiratory: Negative for cough and shortness of breath.   Cardiovascular: Negative for chest pain.  Gastrointestinal: See HPI above - otherwise negative.  Musculoskeletal: Negative for gait problem.  Skin: Negative for rash.  Neurological: Negative for dizziness.  No other specific complaints in a complete review of systems (except as listed in HPI above).     Objective:   Physical Exam BP 102/72  Pulse 65  Temp(Src) 97.1 F (36.2 C) (Oral)  Ht 5'  8" (1.727 m)  Wt 185 lb 6.4 oz (84.097 kg)  BMI 28.2 kg/m2  SpO2 99% Wt Readings from Last 3 Encounters:  11/23/12 185 lb 6.4 oz (84.097 kg)  11/03/11 215 lb (97.523 kg)  09/19/10 164 lb 9.6 oz (74.662 kg)   Constitutional: She appears well-developed and well-nourished. No distress.  HENT: Head: Normocephalic and atraumatic. Ears: B TMs ok, no erythema or effusion; Nose: Nose normal. Mouth/Throat: Oropharynx is clear and moist. No oropharyngeal exudate.  Eyes: Conjunctivae and EOM are normal. Pupils are equal, round, and reactive to light. No scleral icterus.  Neck: Normal range of motion. Neck supple. No JVD present. No thyromegaly present.  Cardiovascular: Normal rate, regular rhythm and normal heart sounds.  No murmur heard. No BLE edema. Pulmonary/Chest: Effort normal and breath sounds normal. No respiratory distress. She has no wheezes.  Abdominal: Soft. Bowel sounds are normal. She exhibits no distension. There is no tenderness. no masses Musculoskeletal: Normal range of motion, no joint effusions. No gross deformities Neurological: She is alert and oriented to person, place, and time. No cranial nerve deficit. Coordination, balance, strength, speech and gait are normal.  Skin: Skin is warm and dry. No rash noted. No erythema.  Psychiatric: She has a normal mood and affect. Her behavior is normal. Judgment and thought content normal.    Lab Results  Component Value Date   WBC 6.8 09/27/2012   HGB 13.9 09/27/2012   HCT 41.0 09/27/2012   PLT 221 09/27/2012  CHOL 171 09/19/2010   TRIG 35.0 09/19/2010   HDL 59.20 09/19/2010   ALT 9 09/19/2010   AST 33 09/19/2010   NA 134* 09/27/2012   K 4.8 09/27/2012   CL 104 09/27/2012   CREATININE 0.80 09/27/2012   BUN 11 09/27/2012   CO2 25 09/19/2010   TSH 1.89 09/19/2010       Assessment & Plan:  CPX - v70.0 - Patient has been counseled on age-appropriate routine health concerns for screening and prevention. These are reviewed and up-to-date.  Immunizations are up-to-date or declined. Labs order and reviewed.  Also See problem list. Medications and labs reviewed today.

## 2012-11-23 NOTE — Assessment & Plan Note (Signed)
Began SSRI by gyn following 2nd pregnancy early 2014 Symptoms much improved, and discussed eventual slow wean off same Patient will consider correct timing in the setting of upcoming winter season given history of seasonal affective disorder Okay to decrease dose to 25 mg daily for the next 6 months, or may remain on 50 mg daily until next visit. Patient call sooner if problems or symptoms

## 2012-11-23 NOTE — Patient Instructions (Addendum)
It was good to see you today. Health Maintenance reviewed - all recommended immunizations and age-appropriate screenings are up-to-date. Test(s) ordered today. Your results will be released to MyChart (or called to you) after review, usually within 72hours after test completion. If any changes need to be made, you will be notified at that same time. Medications reviewed and updated, no changes recommended at this time. Please schedule followup in 6-12 months, call sooner if problems.  Health Maintenance, Females A healthy lifestyle and preventative care can promote health and wellness.  Maintain regular health, dental, and eye exams.  Eat a healthy diet. Foods like vegetables, fruits, whole grains, low-fat dairy products, and lean protein foods contain the nutrients you need without too many calories. Decrease your intake of foods high in solid fats, added sugars, and salt. Get information about a proper diet from your caregiver, if necessary.  Regular physical exercise is one of the most important things you can do for your health. Most adults should get at least 150 minutes of moderate-intensity exercise (any activity that increases your heart rate and causes you to sweat) each week. In addition, most adults need muscle-strengthening exercises on 2 or more days a week.   Maintain a healthy weight. The body mass index (BMI) is a screening tool to identify possible weight problems. It provides an estimate of body fat based on height and weight. Your caregiver can help determine your BMI, and can help you achieve or maintain a healthy weight. For adults 20 years and older:  A BMI below 18.5 is considered underweight.  A BMI of 18.5 to 24.9 is normal.  A BMI of 25 to 29.9 is considered overweight.  A BMI of 30 and above is considered obese.  Maintain normal blood lipids and cholesterol by exercising and minimizing your intake of saturated fat. Eat a balanced diet with plenty of fruits and  vegetables. Blood tests for lipids and cholesterol should begin at age 34 and be repeated every 5 years. If your lipid or cholesterol levels are high, you are over 50, or you are a high risk for heart disease, you may need your cholesterol levels checked more frequently.Ongoing high lipid and cholesterol levels should be treated with medicines if diet and exercise are not effective.  If you smoke, find out from your caregiver how to quit. If you do not use tobacco, do not start.  If you are pregnant, do not drink alcohol. If you are breastfeeding, be very cautious about drinking alcohol. If you are not pregnant and choose to drink alcohol, do not exceed 1 drink per day. One drink is considered to be 12 ounces (355 mL) of beer, 5 ounces (148 mL) of wine, or 1.5 ounces (44 mL) of liquor.  Avoid use of street drugs. Do not share needles with anyone. Ask for help if you need support or instructions about stopping the use of drugs.  High blood pressure causes heart disease and increases the risk of stroke. Blood pressure should be checked at least every 1 to 2 years. Ongoing high blood pressure should be treated with medicines, if weight loss and exercise are not effective.  If you are 87 to 38 years old, ask your caregiver if you should take aspirin to prevent strokes.  Diabetes screening involves taking a blood sample to check your fasting blood sugar level. This should be done once every 3 years, after age 78, if you are within normal weight and without risk factors for diabetes. Testing should  be considered at a younger age or be carried out more frequently if you are overweight and have at least 1 risk factor for diabetes.  Breast cancer screening is essential preventative care for women. You should practice "breast self-awareness." This means understanding the normal appearance and feel of your breasts and may include breast self-examination. Any changes detected, no matter how small, should be  reported to a caregiver. Women in their 10s and 30s should have a clinical breast exam (CBE) by a caregiver as part of a regular health exam every 1 to 3 years. After age 50, women should have a CBE every year. Starting at age 68, women should consider having a mammogram (breast X-ray) every year. Women who have a family history of breast cancer should talk to their caregiver about genetic screening. Women at a high risk of breast cancer should talk to their caregiver about having an MRI and a mammogram every year.  The Pap test is a screening test for cervical cancer. Women should have a Pap test starting at age 19. Between ages 65 and 20, Pap tests should be repeated every 2 years. Beginning at age 6, you should have a Pap test every 3 years as long as the past 3 Pap tests have been normal. If you had a hysterectomy for a problem that was not cancer or a condition that could lead to cancer, then you no longer need Pap tests. If you are between ages 45 and 18, and you have had normal Pap tests going back 10 years, you no longer need Pap tests. If you have had past treatment for cervical cancer or a condition that could lead to cancer, you need Pap tests and screening for cancer for at least 20 years after your treatment. If Pap tests have been discontinued, risk factors (such as a new sexual partner) need to be reassessed to determine if screening should be resumed. Some women have medical problems that increase the chance of getting cervical cancer. In these cases, your caregiver may recommend more frequent screening and Pap tests.  The human papillomavirus (HPV) test is an additional test that may be used for cervical cancer screening. The HPV test looks for the virus that can cause the cell changes on the cervix. The cells collected during the Pap test can be tested for HPV. The HPV test could be used to screen women aged 36 years and older, and should be used in women of any age who have unclear Pap test  results. After the age of 45, women should have HPV testing at the same frequency as a Pap test.  Colorectal cancer can be detected and often prevented. Most routine colorectal cancer screening begins at the age of 75 and continues through age 74. However, your caregiver may recommend screening at an earlier age if you have risk factors for colon cancer. On a yearly basis, your caregiver may provide home test kits to check for hidden blood in the stool. Use of a small camera at the end of a tube, to directly examine the colon (sigmoidoscopy or colonoscopy), can detect the earliest forms of colorectal cancer. Talk to your caregiver about this at age 32, when routine screening begins. Direct examination of the colon should be repeated every 5 to 10 years through age 5, unless early forms of pre-cancerous polyps or small growths are found.  Hepatitis C blood testing is recommended for all people born from [birth year] through 1966 and any individual with known risks for  hepatitis C.  Practice safe sex. Use condoms and avoid high-risk sexual practices to reduce the spread of sexually transmitted infections (STIs). Sexually active women aged 67 and younger should be checked for Chlamydia, which is a common sexually transmitted infection. Older women with new or multiple partners should also be tested for Chlamydia. Testing for other STIs is recommended if you are sexually active and at increased risk.  Osteoporosis is a disease in which the bones lose minerals and strength with aging. This can result in serious bone fractures. The risk of osteoporosis can be identified using a bone density scan. Women ages 67 and over and women at risk for fractures or osteoporosis should discuss screening with their caregivers. Ask your caregiver whether you should be taking a calcium supplement or vitamin D to reduce the rate of osteoporosis.  Menopause can be associated with physical symptoms and risks. Hormone replacement therapy  is available to decrease symptoms and risks. You should talk to your caregiver about whether hormone replacement therapy is right for you.  Use sunscreen with a sun protection factor (SPF) of 30 or greater. Apply sunscreen liberally and repeatedly throughout the day. You should seek shade when your shadow is shorter than you. Protect yourself by wearing long sleeves, pants, a wide-brimmed hat, and sunglasses year round, whenever you are outdoors.  Notify your caregiver of new moles or changes in moles, especially if there is a change in shape or color. Also notify your caregiver if a mole is larger than the size of a pencil eraser.  Stay current with your immunizations. Document Released: 09/29/2010 Document Revised: 06/08/2011 Document Reviewed: 09/29/2010 [facility] Patient Information 2014 [place], [place].

## 2012-12-02 ENCOUNTER — Other Ambulatory Visit (INDEPENDENT_AMBULATORY_CARE_PROVIDER_SITE_OTHER): Payer: PRIVATE HEALTH INSURANCE

## 2012-12-02 DIAGNOSIS — Z0000 Encounter for general adult medical examination without abnormal findings: Secondary | ICD-10-CM

## 2012-12-02 LAB — CBC WITH DIFFERENTIAL/PLATELET
Eosinophils Relative: 0.9 % (ref 0.0–5.0)
Monocytes Relative: 7.3 % (ref 3.0–12.0)
Neutrophils Relative %: 44.3 % (ref 43.0–77.0)
Platelets: 214 10*3/uL (ref 150.0–400.0)
WBC: 4.6 10*3/uL (ref 4.5–10.5)

## 2012-12-02 LAB — URINALYSIS, ROUTINE W REFLEX MICROSCOPIC
Hgb urine dipstick: NEGATIVE
Leukocytes, UA: NEGATIVE
Nitrite: NEGATIVE
Total Protein, Urine: NEGATIVE
pH: 6 (ref 5.0–8.0)

## 2012-12-02 LAB — BASIC METABOLIC PANEL
BUN: 13 mg/dL (ref 6–23)
Calcium: 9.2 mg/dL (ref 8.4–10.5)
Creatinine, Ser: 0.6 mg/dL (ref 0.4–1.2)
GFR: 110.4 mL/min (ref >60.00)

## 2012-12-02 LAB — TSH: TSH: 1.03 u[IU]/mL (ref 0.35–5.50)

## 2012-12-02 LAB — HEPATIC FUNCTION PANEL
ALT: 15 U/L (ref 0–35)
Alkaline Phosphatase: 72 U/L (ref 39–117)
Bilirubin, Direct: 0.1 mg/dL (ref 0.0–0.3)
Total Protein: 7.5 g/dL (ref 6.0–8.3)

## 2012-12-02 LAB — LIPID PANEL
Cholesterol: 141 mg/dL (ref 0–200)
VLDL: 10.2 mg/dL (ref 0.0–40.0)

## 2013-01-23 ENCOUNTER — Encounter: Payer: Self-pay | Admitting: Internal Medicine

## 2013-01-24 ENCOUNTER — Other Ambulatory Visit: Payer: Self-pay | Admitting: *Deleted

## 2013-01-24 MED ORDER — SERTRALINE HCL 50 MG PO TABS: 50.0000 mg | ORAL_TABLET | Freq: Every day | ORAL | Status: DC

## 2013-01-24 NOTE — Telephone Encounter (Signed)
Sent email needing refill on her sertraline sent to [facility]...lmb

## 2013-01-30 ENCOUNTER — Other Ambulatory Visit: Payer: Self-pay | Admitting: *Deleted

## 2013-01-30 MED ORDER — SERTRALINE HCL 25 MG PO TABS: 25.0000 mg | ORAL_TABLET | Freq: Every day | ORAL | Status: DC

## 2013-01-30 NOTE — Telephone Encounter (Signed)
Sent email needing zoloft 25mg  sent in instead of 50 mg...lmb

## 2013-02-02 ENCOUNTER — Other Ambulatory Visit: Payer: Self-pay

## 2013-04-10 LAB — HM PAP SMEAR

## 2013-12-27 ENCOUNTER — Encounter: Payer: Self-pay | Admitting: Internal Medicine

## 2013-12-27 ENCOUNTER — Other Ambulatory Visit (INDEPENDENT_AMBULATORY_CARE_PROVIDER_SITE_OTHER): Payer: PRIVATE HEALTH INSURANCE

## 2013-12-27 ENCOUNTER — Ambulatory Visit (INDEPENDENT_AMBULATORY_CARE_PROVIDER_SITE_OTHER): Payer: PRIVATE HEALTH INSURANCE | Admitting: Internal Medicine

## 2013-12-27 VITALS — BP 108/78 | HR 64 | Temp 98.5°F | Ht 68.0 in | Wt 155.2 lb

## 2013-12-27 DIAGNOSIS — Z0000 Encounter for general adult medical examination without abnormal findings: Secondary | ICD-10-CM

## 2013-12-27 LAB — CBC WITH DIFFERENTIAL/PLATELET
Basophils Absolute: 0 10*3/uL (ref 0.0–0.1)
Basophils Relative: 0.6 % (ref 0.0–3.0)
EOS PCT: 1.4 % (ref 0.0–5.0)
Eosinophils Absolute: 0.1 10*3/uL (ref 0.0–0.7)
HEMATOCRIT: 39.7 % (ref 36.0–46.0)
HEMOGLOBIN: 13.4 g/dL (ref 12.0–15.0)
LYMPHS ABS: 2.4 10*3/uL (ref 0.7–4.0)
Lymphocytes Relative: 44 % (ref 12.0–46.0)
MCHC: 33.7 g/dL (ref 30.0–36.0)
MCV: 91.2 fl (ref 78.0–100.0)
Monocytes Absolute: 0.4 10*3/uL (ref 0.1–1.0)
Monocytes Relative: 7.1 % (ref 3.0–12.0)
NEUTROS ABS: 2.6 10*3/uL (ref 1.4–7.7)
Neutrophils Relative %: 46.9 % (ref 43.0–77.0)
PLATELETS: 246 10*3/uL (ref 150.0–400.0)
RBC: 4.36 Mil/uL (ref 3.87–5.11)
RDW: 13.9 % (ref 11.5–15.5)
WBC: 5.5 10*3/uL (ref 4.0–10.5)

## 2013-12-27 LAB — URINALYSIS, ROUTINE W REFLEX MICROSCOPIC
BILIRUBIN URINE: NEGATIVE
HGB URINE DIPSTICK: NEGATIVE
Ketones, ur: NEGATIVE
LEUKOCYTES UA: NEGATIVE
NITRITE: NEGATIVE
RBC / HPF: NONE SEEN (ref 0–?)
Specific Gravity, Urine: <=1.005 — AB (ref 1.000–1.030)
Total Protein, Urine: NEGATIVE
UROBILINOGEN UA: 0.2 (ref 0.0–1.0)
Urine Glucose: NEGATIVE
WBC UA: NONE SEEN (ref 0–?)
pH: 5.5 (ref 5.0–8.0)

## 2013-12-27 LAB — LIPID PANEL
CHOL/HDL RATIO: 3
Cholesterol: 168 mg/dL (ref 0–200)
HDL: 62.9 mg/dL (ref >39.00)
LDL CALC: 97 mg/dL (ref 0–99)
NONHDL: 105.1
Triglycerides: 41 mg/dL (ref 0.0–149.0)
VLDL: 8.2 mg/dL (ref 0.0–40.0)

## 2013-12-27 LAB — BASIC METABOLIC PANEL
BUN: 12 mg/dL (ref 6–23)
CO2: 27 mEq/L (ref 19–32)
Calcium: 9.5 mg/dL (ref 8.4–10.5)
Chloride: 103 mEq/L (ref 96–112)
Creatinine, Ser: 0.8 mg/dL (ref 0.4–1.2)
GFR: 84.86 mL/min (ref >60.00)
Glucose, Bld: 86 mg/dL (ref 70–99)
POTASSIUM: 4.6 meq/L (ref 3.5–5.1)
SODIUM: 136 meq/L (ref 135–145)

## 2013-12-27 LAB — HEPATIC FUNCTION PANEL
ALT: 14 U/L (ref 0–35)
AST: 19 U/L (ref 0–37)
Albumin: 4.4 g/dL (ref 3.5–5.2)
Alkaline Phosphatase: 60 U/L (ref 39–117)
BILIRUBIN TOTAL: 0.8 mg/dL (ref 0.2–1.2)
Bilirubin, Direct: 0.1 mg/dL (ref 0.0–0.3)
Total Protein: 8.2 g/dL (ref 6.0–8.3)

## 2013-12-27 LAB — TSH: TSH: 1.53 u[IU]/mL (ref 0.35–4.50)

## 2013-12-27 MED ORDER — LEVOTHYROXINE SODIUM 75 MCG PO TABS: 75.0000 ug | ORAL_TABLET | Freq: Every day | ORAL | Status: DC

## 2013-12-27 NOTE — Progress Notes (Signed)
Pre visit review using our clinic review tool, if applicable. No additional management support is needed unless otherwise documented below in the visit note. 

## 2013-12-27 NOTE — Patient Instructions (Addendum)
It was good to see you today.  We have reviewed your prior records including labs and tests today  Health Maintenance reviewed - all recommended immunizations and age-appropriate screenings are up-to-date.  Test(s) ordered today. Your results will be released to [place] (or called to you) after review, usually within 72hours after test completion. If any changes need to be made, you will be notified at that same time.  Medications reviewed and updated, no changes recommended at this time.  Please schedule followup in 12 months for annual exam and labs, call sooner if problems.  Health Maintenance Adopting a healthy lifestyle and getting preventive care can go a long way to promote health and wellness. Talk with your health care provider about what schedule of regular examinations is right for you. This is a good chance for you to check in with your provider about disease prevention and staying healthy. In between checkups, there are plenty of things you can do on your own. Experts have done a lot of research about which lifestyle changes and preventive measures are most likely to keep you healthy. Ask your health care provider for more information. WEIGHT AND DIET  Eat a healthy diet  Be sure to include plenty of vegetables, fruits, low-fat dairy products, and lean protein.  Do not eat a lot of foods high in solid fats, added sugars, or salt.  Get regular exercise. This is one of the most important things you can do for your health.  Most adults should exercise for at least 150 minutes each week. The exercise should increase your heart rate and make you sweat (moderate-intensity exercise).  Most adults should also do strengthening exercises at least twice a week. This is in addition to the moderate-intensity exercise.  Maintain a healthy weight  Body mass index (BMI) is a measurement that can be used to identify possible weight problems. It estimates body fat based on height and weight.  Your health care provider can help determine your BMI and help you achieve or maintain a healthy weight.  For females 20 years of age and older:   A BMI below 18.5 is considered underweight.  A BMI of 18.5 to 24.9 is normal.  A BMI of 25 to 29.9 is considered overweight.  A BMI of 30 and above is considered obese.  Watch levels of cholesterol and blood lipids  You should start having your blood tested for lipids and cholesterol at 39 years of age, then have this test every 5 years.  You may need to have your cholesterol levels checked more often if:  Your lipid or cholesterol levels are high.  You are older than 39 years of age.  You are at high risk for heart disease.  CANCER SCREENING   Lung Cancer  Lung cancer screening is recommended for adults 1-59 years old who are at high risk for lung cancer because of a history of smoking.  A yearly low-dose CT scan of the lungs is recommended for people who:  Currently smoke.  Have quit within the past 15 years.  Have at least a 30-pack-year history of smoking. A pack year is smoking an average of one pack of cigarettes a day for 1 year.  Yearly screening should continue until it has been 15 years since you quit.  Yearly screening should stop if you develop a health problem that would prevent you from having lung cancer treatment.  Breast Cancer  Practice breast self-awareness. This means understanding how your breasts normally appear and  feel.  It also means doing regular breast self-exams. Let your health care provider know about any changes, no matter how small.  If you are in your 20s or 30s, you should have a clinical breast exam (CBE) by a health care provider every 1-3 years as part of a regular health exam.  If you are 40 or older, have a CBE every year. Also consider having a breast X-ray (mammogram) every year.  If you have a family history of breast cancer, talk to your health care provider about genetic  screening.  If you are at high risk for breast cancer, talk to your health care provider about having an MRI and a mammogram every year.  Breast cancer gene (BRCA) assessment is recommended for women who have family members with BRCA-related cancers. BRCA-related cancers include:  Breast.  Ovarian.  Tubal.  Peritoneal cancers.  Results of the assessment will determine the need for genetic counseling and BRCA1 and BRCA2 testing. Cervical Cancer Routine pelvic examinations to screen for cervical cancer are no longer recommended for nonpregnant women who are considered low risk for cancer of the pelvic organs (ovaries, uterus, and vagina) and who do not have symptoms. A pelvic examination may be necessary if you have symptoms including those associated with pelvic infections. Ask your health care provider if a screening pelvic exam is right for you.   The Pap test is the screening test for cervical cancer for women who are considered at risk.  If you had a hysterectomy for a problem that was not cancer or a condition that could lead to cancer, then you no longer need Pap tests.  If you are older than 65 years, and you have had normal Pap tests for the past 10 years, you no longer need to have Pap tests.  If you have had past treatment for cervical cancer or a condition that could lead to cancer, you need Pap tests and screening for cancer for at least 20 years after your treatment.  If you no longer get a Pap test, assess your risk factors if they change (such as having a new sexual partner). This can affect whether you should start being screened again.  Some women have medical problems that increase their chance of getting cervical cancer. If this is the case for you, your health care provider may recommend more frequent screening and Pap tests.  The human papillomavirus (HPV) test is another test that may be used for cervical cancer screening. The HPV test looks for the virus that can  cause cell changes in the cervix. The cells collected during the Pap test can be tested for HPV.  The HPV test can be used to screen women 30 years of age and older. Getting tested for HPV can extend the interval between normal Pap tests from three to five years.  An HPV test also should be used to screen women of any age who have unclear Pap test results.  After 39 years of age, women should have HPV testing as often as Pap tests.  Colorectal Cancer  This type of cancer can be detected and often prevented.  Routine colorectal cancer screening usually begins at 39 years of age and continues through 39 years of age.  Your health care provider may recommend screening at an earlier age if you have risk factors for colon cancer.  Your health care provider may also recommend using home test kits to check for hidden blood in the stool.  A small camera   at the end of a tube can be used to examine your colon directly (sigmoidoscopy or colonoscopy). This is done to check for the earliest forms of colorectal cancer.  Routine screening usually begins at age 50.  Direct examination of the colon should be repeated every 5-10 years through 39 years of age. However, you may need to be screened more often if early forms of precancerous polyps or small growths are found. Skin Cancer  Check your skin from head to toe regularly.  Tell your health care provider about any new moles or changes in moles, especially if there is a change in a mole's shape or color.  Also tell your health care provider if you have a mole that is larger than the size of a pencil eraser.  Always use sunscreen. Apply sunscreen liberally and repeatedly throughout the day.  Protect yourself by wearing long sleeves, pants, a wide-brimmed hat, and sunglasses whenever you are outside. HEART DISEASE, DIABETES, AND HIGH BLOOD PRESSURE   Have your blood pressure checked at least every 1-2 years. High blood pressure causes heart  disease and increases the risk of stroke.  If you are between 55 years and 79 years old, ask your health care provider if you should take aspirin to prevent strokes.  Have regular diabetes screenings. This involves taking a blood sample to check your fasting blood sugar level.  If you are at a normal weight and have a low risk for diabetes, have this test once every three years after 39 years of age.  If you are overweight and have a high risk for diabetes, consider being tested at a younger age or more often. PREVENTING INFECTION  Hepatitis B  If you have a higher risk for hepatitis B, you should be screened for this virus. You are considered at high risk for hepatitis B if:  You were born in a country where hepatitis B is common. Ask your health care provider which countries are considered high risk.  Your parents were born in a high-risk country, and you have not been immunized against hepatitis B (hepatitis B vaccine).  You have HIV or AIDS.  You use needles to inject street drugs.  You live with someone who has hepatitis B.  You have had sex with someone who has hepatitis B.  You get hemodialysis treatment.  You take certain medicines for conditions, including cancer, organ transplantation, and autoimmune conditions. Hepatitis C  Blood testing is recommended for:  Everyone born from 1945 through 1965.  Anyone with known risk factors for hepatitis C. Sexually transmitted infections (STIs)  You should be screened for sexually transmitted infections (STIs) including gonorrhea and chlamydia if:  You are sexually active and are younger than 39 years of age.  You are older than 39 years of age and your health care provider tells you that you are at risk for this type of infection.  Your sexual activity has changed since you were last screened and you are at an increased risk for chlamydia or gonorrhea. Ask your health care provider if you are at risk.  If you do not have  HIV, but are at risk, it may be recommended that you take a prescription medicine daily to prevent HIV infection. This is called pre-exposure prophylaxis (PrEP). You are considered at risk if:  You are sexually active and do not regularly use condoms or know the HIV status of your partner(s).  You take drugs by injection.  You are sexually active with a partner   who has HIV. Talk with your health care provider about whether you are at high risk of being infected with HIV. If you choose to begin PrEP, you should first be tested for HIV. You should then be tested every 3 months for as long as you are taking PrEP.  PREGNANCY   If you are premenopausal and you may become pregnant, ask your health care provider about preconception counseling.  If you may become pregnant, take 400 to 800 micrograms (mcg) of folic acid every day.  If you want to prevent pregnancy, talk to your health care provider about birth control (contraception). OSTEOPOROSIS AND MENOPAUSE   Osteoporosis is a disease in which the bones lose minerals and strength with aging. This can result in serious bone fractures. Your risk for osteoporosis can be identified using a bone density scan.  If you are 65 years of age or older, or if you are at risk for osteoporosis and fractures, ask your health care provider if you should be screened.  Ask your health care provider whether you should take a calcium or vitamin D supplement to lower your risk for osteoporosis.  Menopause may have certain physical symptoms and risks.  Hormone replacement therapy may reduce some of these symptoms and risks. Talk to your health care provider about whether hormone replacement therapy is right for you.  HOME CARE INSTRUCTIONS   Schedule regular health, dental, and eye exams.  Stay current with your immunizations.   Do not use any tobacco products including cigarettes, chewing tobacco, or electronic cigarettes.  If you are pregnant, do not  drink alcohol.  If you are breastfeeding, limit how much and how often you drink alcohol.  Limit alcohol intake to no more than 1 drink per day for nonpregnant women. One drink equals 12 ounces of beer, 5 ounces of wine, or 1 ounces of hard liquor.  Do not use street drugs.  Do not share needles.  Ask your health care provider for help if you need support or information about quitting drugs.  Tell your health care provider if you often feel depressed.  Tell your health care provider if you have ever been abused or do not feel safe at home. Document Released: 09/29/2010 Document Revised: 07/31/2013 Document Reviewed: 02/15/2013 [employer] Patient Information 2015 [employer]. This information is not intended to replace advice given to you by your health care provider. Make sure you discuss any questions you have with your health care provider.  

## 2013-12-27 NOTE — Progress Notes (Signed)
Subjective:    Patient ID: Jennifer Beasley, female    DOB: 08-23-1975, 38 y.o.   MRN: [id number]  HPI  patient is here today for annual physical. Patient feels well and has no complaints.  Also reviewed chronic medical issues and interval medical events  Past Medical History  Diagnosis Date  . Cavus deformity of foot, acquired   . CHONDROMALACIA PATELLA, LEFT   . Unspecified hypothyroidism    Family History  Problem Relation Age of Onset  . Hyperlipidemia Mother   . Stroke Mother 56    A fib  . Atrial fibrillation Mother   . Diabetes Mother   . Hypothyroidism Mother   . Ovarian cancer Maternal Aunt   . Breast cancer Maternal Aunt 63  . Breast cancer Maternal Grandmother 23  . Prostate cancer Maternal Grandfather   . Alcohol abuse Other     other relatives  . Diabetes Paternal Grandmother    History  Substance Use Topics  . Smoking status: Former Smoker    Quit date: 04/18/1998  . Smokeless tobacco: Never Used  . Alcohol Use: Yes    Review of Systems  Constitutional: Negative for fatigue and unexpected weight change.  Respiratory: Negative for cough, shortness of breath and wheezing.   Cardiovascular: Negative for chest pain, palpitations and leg swelling.  Gastrointestinal: Negative for nausea, abdominal pain and diarrhea.  Neurological: Negative for dizziness, weakness, light-headedness and headaches.  Psychiatric/Behavioral: Negative for dysphoric mood. The patient is not nervous/anxious.   All other systems reviewed and are negative.      Objective:   Physical Exam  BP 108/78  Pulse 64  Temp(Src) 98.5 F (36.9 C) (Oral)  Ht 5' 8"  (1.727 m)  Wt 155 lb 4 oz (70.421 kg)  BMI 23.61 kg/m2  SpO2 97% Wt Readings from Last 3 Encounters:  12/27/13 155 lb 4 oz (70.421 kg)  11/23/12 185 lb 6.4 oz (84.097 kg)  11/03/11 215 lb (97.523 kg)   Constitutional: She appears well-developed and well-nourished. No distress.  HENT: Head: Normocephalic and  atraumatic. Ears: B TMs ok, no erythema or effusion; Nose: Nose normal. Mouth/Throat: Oropharynx is clear and moist. No oropharyngeal exudate.  Eyes: Conjunctivae and EOM are normal. Pupils are equal, round, and reactive to light. No scleral icterus.  Neck: Normal range of motion. Neck supple. No JVD present. No thyromegaly present.  Cardiovascular: Normal rate, regular rhythm and normal heart sounds.  No murmur heard. No BLE edema. Pulmonary/Chest: Effort normal and breath sounds normal. No respiratory distress. She has no wheezes.  Abdominal: Soft. Bowel sounds are normal. She exhibits no distension. There is no tenderness. no masses GU/breast: defer to gyn Musculoskeletal: Normal range of motion, no joint effusions. No gross deformities Neurological: She is alert and oriented to person, place, and time. No cranial nerve deficit. Coordination, balance, strength, speech and gait are normal.  Skin: Skin is warm and dry. No rash noted. No erythema.  Psychiatric: She has a normal mood and affect. Her behavior is normal. Judgment and thought content normal.     Lab Results  Component Value Date   WBC 4.6 12/02/2012   HGB 13.1 12/02/2012   HCT 38.4 12/02/2012   PLT 214.0 12/02/2012   GLUCOSE 87 12/02/2012   CHOL 141 12/02/2012   TRIG 51.0 12/02/2012   HDL 46.80 12/02/2012   LDLCALC 84 12/02/2012   ALT 15 12/02/2012   AST 18 12/02/2012   NA 135 12/02/2012   K 3.7 12/02/2012   CL  103 12/02/2012   CREATININE 0.6 12/02/2012   BUN 13 12/02/2012   CO2 27 12/02/2012   TSH 1.03 12/02/2012    Ct Abdomen Pelvis W Contrast  09/27/2012   *RADIOLOGY REPORT*  Clinical Data: Quadrant pain for 2 days, worsening.  Nausea.  CT ABDOMEN AND PELVIS WITH CONTRAST  Technique:  Multidetector CT imaging of the abdomen and pelvis was performed following the standard protocol during bolus administration of intravenous contrast.  Contrast:100 ml Omnipaque-300.  Comparison: None.  Findings: Lung bases are clear.  No pleural or pericardial effusion.   The gallbladder, liver, spleen, adrenal glands, pancreas and kidneys appear normal.  The appendix is well visualized and normal in appearance.  The stomach and small and large bowel appear normal.  There is no lymphadenopathy or fluid.  IUD is in place in the uterus.  Adnexa are unremarkable.  No focal bony abnormality is identified.  IMPRESSION: Negative for appendicitis or other acute abnormality.  Negative exam.   Original Report Authenticated By: Orlean Patten, M.D.       Assessment & Plan:   CPX/v70.0 - Patient has been counseled on age-appropriate routine health concerns for screening and prevention. These are reviewed and up-to-date. Immunizations are up-to-date or declined. Labs ordered and reviewed.

## 2014-01-29 ENCOUNTER — Encounter: Payer: Self-pay | Admitting: Internal Medicine

## 2014-06-27 ENCOUNTER — Encounter: Payer: Self-pay | Admitting: Internal Medicine

## 2014-06-28 MED ORDER — SERTRALINE HCL 50 MG PO TABS: 50.0000 mg | ORAL_TABLET | Freq: Every day | ORAL | Status: DC

## 2015-01-02 ENCOUNTER — Ambulatory Visit (INDEPENDENT_AMBULATORY_CARE_PROVIDER_SITE_OTHER): Payer: PRIVATE HEALTH INSURANCE | Admitting: Internal Medicine

## 2015-01-02 ENCOUNTER — Other Ambulatory Visit (INDEPENDENT_AMBULATORY_CARE_PROVIDER_SITE_OTHER): Payer: PRIVATE HEALTH INSURANCE

## 2015-01-02 ENCOUNTER — Encounter: Payer: Self-pay | Admitting: Internal Medicine

## 2015-01-02 VITALS — BP 108/70 | HR 66 | Temp 98.1°F | Resp 13 | Ht 68.0 in | Wt 180.0 lb

## 2015-01-02 DIAGNOSIS — Z0000 Encounter for general adult medical examination without abnormal findings: Secondary | ICD-10-CM

## 2015-01-02 LAB — CBC WITH DIFFERENTIAL/PLATELET
BASOS ABS: 0 10*3/uL (ref 0.0–0.1)
Basophils Relative: 0.6 % (ref 0.0–3.0)
EOS ABS: 0 10*3/uL (ref 0.0–0.7)
Eosinophils Relative: 1 % (ref 0.0–5.0)
HCT: 37.1 % (ref 36.0–46.0)
Hemoglobin: 12.4 g/dL (ref 12.0–15.0)
LYMPHS ABS: 1.8 10*3/uL (ref 0.7–4.0)
Lymphocytes Relative: 44 % (ref 12.0–46.0)
MCHC: 33.5 g/dL (ref 30.0–36.0)
MCV: 91.2 fl (ref 78.0–100.0)
Monocytes Absolute: 0.3 10*3/uL (ref 0.1–1.0)
Monocytes Relative: 7.1 % (ref 3.0–12.0)
NEUTROS ABS: 1.9 10*3/uL (ref 1.4–7.7)
NEUTROS PCT: 47.3 % (ref 43.0–77.0)
PLATELETS: 242 10*3/uL (ref 150.0–400.0)
RBC: 4.07 Mil/uL (ref 3.87–5.11)
RDW: 13.5 % (ref 11.5–15.5)
WBC: 4.1 10*3/uL (ref 4.0–10.5)

## 2015-01-02 LAB — COMPREHENSIVE METABOLIC PANEL
ALT: 12 U/L (ref 0–35)
AST: 15 U/L (ref 0–37)
Albumin: 4.1 g/dL (ref 3.5–5.2)
Alkaline Phosphatase: 64 U/L (ref 39–117)
BILIRUBIN TOTAL: 0.7 mg/dL (ref 0.2–1.2)
BUN: 13 mg/dL (ref 6–23)
CO2: 27 meq/L (ref 19–32)
CREATININE: 0.74 mg/dL (ref 0.40–1.20)
Calcium: 9 mg/dL (ref 8.4–10.5)
Chloride: 105 mEq/L (ref 96–112)
GFR: 92.36 mL/min (ref >60.00)
GLUCOSE: 93 mg/dL (ref 70–99)
Potassium: 4 mEq/L (ref 3.5–5.1)
SODIUM: 138 meq/L (ref 135–145)
Total Protein: 7.6 g/dL (ref 6.0–8.3)

## 2015-01-02 LAB — LIPID PANEL
CHOL/HDL RATIO: 2
Cholesterol: 161 mg/dL (ref 0–200)
HDL: 67.1 mg/dL (ref >39.00)
LDL Cholesterol: 84 mg/dL (ref 0–99)
NONHDL: 93.63
Triglycerides: 50 mg/dL (ref 0.0–149.0)
VLDL: 10 mg/dL (ref 0.0–40.0)

## 2015-01-02 LAB — TSH: TSH: 1.59 u[IU]/mL (ref 0.35–4.50)

## 2015-01-02 LAB — T4, FREE: Free T4: 1.06 ng/dL (ref 0.60–1.60)

## 2015-01-02 MED ORDER — SERTRALINE HCL 50 MG PO TABS: 50.0000 mg | ORAL_TABLET | Freq: Every day | ORAL | Status: DC

## 2015-01-02 MED ORDER — LEVOTHYROXINE SODIUM 75 MCG PO TABS: 75.0000 ug | ORAL_TABLET | Freq: Every day | ORAL | Status: DC

## 2015-01-02 NOTE — Patient Instructions (Signed)
We have reviewed your prior records including labs and tests today.  Test(s) ordered today. Your results will be released to [place] (or called to you) after review, usually within 72hours after test completion. If any changes need to be made, you will be notified at that same time.  All other Health Maintenance issues reviewed.   All recommended immunizations and age-appropriate screenings are up-to-date.  No immunizations administered today.   Medications reviewed.  No changes recommended at this time.  Your prescription(s) have been submitted to your pharmacy. Please take as directed and contact our office if you believe you are having problem(s) with the medication(s).  Health Maintenance, Female Adopting a healthy lifestyle and getting preventive care can go a long way to promote health and wellness. Talk with your health care provider about what schedule of regular examinations is right for you. This is a good chance for you to check in with your provider about disease prevention and staying healthy. In between checkups, there are plenty of things you can do on your own. Experts have done a lot of research about which lifestyle changes and preventive measures are most likely to keep you healthy. Ask your health care provider for more information. WEIGHT AND DIET  Eat a healthy diet  Be sure to include plenty of vegetables, fruits, low-fat dairy products, and lean protein.  Do not eat a lot of foods high in solid fats, added sugars, or salt.  Get regular exercise. This is one of the most important things you can do for your health.  Most adults should exercise for at least 150 minutes each week. The exercise should increase your heart rate and make you sweat (moderate-intensity exercise).  Most adults should also do strengthening exercises at least twice a week. This is in addition to the moderate-intensity exercise.  Maintain a healthy weight  Body mass index (BMI) is a  measurement that can be used to identify possible weight problems. It estimates body fat based on height and weight. Your health care provider can help determine your BMI and help you achieve or maintain a healthy weight.  For females 64 years of age and older:   A BMI below 18.5 is considered underweight.  A BMI of 18.5 to 24.9 is normal.  A BMI of 25 to 29.9 is considered overweight.  A BMI of 30 and above is considered obese.  Watch levels of cholesterol and blood lipids  You should start having your blood tested for lipids and cholesterol at 40 years of age, then have this test every 5 years.  You may need to have your cholesterol levels checked more often if:  Your lipid or cholesterol levels are high.  You are older than 40 years of age.  You are at high risk for heart disease.  CANCER SCREENING   Lung Cancer  Lung cancer screening is recommended for adults 63-5 years old who are at high risk for lung cancer because of a history of smoking.  A yearly low-dose CT scan of the lungs is recommended for people who:  Currently smoke.  Have quit within the past 15 years.  Have at least a 30-pack-year history of smoking. A pack year is smoking an average of one pack of cigarettes a day for 1 year.  Yearly screening should continue until it has been 15 years since you quit.  Yearly screening should stop if you develop a health problem that would prevent you from having lung cancer treatment.  Breast Cancer  Practice breast self-awareness. This means understanding how your breasts normally appear and feel.  It also means doing regular breast self-exams. Let your health care provider know about any changes, no matter how small.  If you are in your 20s or 30s, you should have a clinical breast exam (CBE) by a health care provider every 1-3 years as part of a regular health exam.  If you are 65 or older, have a CBE every year. Also consider having a breast X-ray  (mammogram) every year.  If you have a family history of breast cancer, talk to your health care provider about genetic screening.  If you are at high risk for breast cancer, talk to your health care provider about having an MRI and a mammogram every year.  Breast cancer gene (BRCA) assessment is recommended for women who have family members with BRCA-related cancers. BRCA-related cancers include:  Breast.  Ovarian.  Tubal.  Peritoneal cancers.  Results of the assessment will determine the need for genetic counseling and BRCA1 and BRCA2 testing. Cervical Cancer Your health care provider may recommend that you be screened regularly for cancer of the pelvic organs (ovaries, uterus, and vagina). This screening involves a pelvic examination, including checking for microscopic changes to the surface of your cervix (Pap test). You may be encouraged to have this screening done every 3 years, beginning at age 59.  For women ages 28-65, health care providers may recommend pelvic exams and Pap testing every 3 years, or they may recommend the Pap and pelvic exam, combined with testing for human papilloma virus (HPV), every 5 years. Some types of HPV increase your risk of cervical cancer. Testing for HPV may also be done on women of any age with unclear Pap test results.  Other health care providers may not recommend any screening for nonpregnant women who are considered low risk for pelvic cancer and who do not have symptoms. Ask your health care provider if a screening pelvic exam is right for you.  If you have had past treatment for cervical cancer or a condition that could lead to cancer, you need Pap tests and screening for cancer for at least 20 years after your treatment. If Pap tests have been discontinued, your risk factors (such as having a new sexual partner) need to be reassessed to determine if screening should resume. Some women have medical problems that increase the chance of getting  cervical cancer. In these cases, your health care provider may recommend more frequent screening and Pap tests. Colorectal Cancer  This type of cancer can be detected and often prevented.  Routine colorectal cancer screening usually begins at 40 years of age and continues through 40 years of age.  Your health care provider may recommend screening at an earlier age if you have risk factors for colon cancer.  Your health care provider may also recommend using home test kits to check for hidden blood in the stool.  A small camera at the end of a tube can be used to examine your colon directly (sigmoidoscopy or colonoscopy). This is done to check for the earliest forms of colorectal cancer.  Routine screening usually begins at age 53.  Direct examination of the colon should be repeated every 5-10 years through 40 years of age. However, you may need to be screened more often if early forms of precancerous polyps or small growths are found. Skin Cancer  Check your skin from head to toe regularly.  Tell your health care provider about any  new moles or changes in moles, especially if there is a change in a mole's shape or color.  Also tell your health care provider if you have a mole that is larger than the size of a pencil eraser.  Always use sunscreen. Apply sunscreen liberally and repeatedly throughout the day.  Protect yourself by wearing long sleeves, pants, a wide-brimmed hat, and sunglasses whenever you are outside. HEART DISEASE, DIABETES, AND HIGH BLOOD PRESSURE   High blood pressure causes heart disease and increases the risk of stroke. High blood pressure is more likely to develop in:  People who have blood pressure in the high end of the normal range (130-139/85-89 mm Hg).  People who are overweight or obese.  People who are African American.  If you are 43-33 years of age, have your blood pressure checked every 3-5 years. If you are 8 years of age or older, have your blood  pressure checked every year. You should have your blood pressure measured twice--once when you are at a hospital or clinic, and once when you are not at a hospital or clinic. Record the average of the two measurements. To check your blood pressure when you are not at a hospital or clinic, you can use:  An automated blood pressure machine at a pharmacy.  A home blood pressure monitor.  If you are between 52 years and 35 years old, ask your health care provider if you should take aspirin to prevent strokes.  Have regular diabetes screenings. This involves taking a blood sample to check your fasting blood sugar level.  If you are at a normal weight and have a low risk for diabetes, have this test once every three years after 40 years of age.  If you are overweight and have a high risk for diabetes, consider being tested at a younger age or more often. PREVENTING INFECTION  Hepatitis B  If you have a higher risk for hepatitis B, you should be screened for this virus. You are considered at high risk for hepatitis B if:  You were born in a country where hepatitis B is common. Ask your health care provider which countries are considered high risk.  Your parents were born in a high-risk country, and you have not been immunized against hepatitis B (hepatitis B vaccine).  You have HIV or AIDS.  You use needles to inject street drugs.  You live with someone who has hepatitis B.  You have had sex with someone who has hepatitis B.  You get hemodialysis treatment.  You take certain medicines for conditions, including cancer, organ transplantation, and autoimmune conditions. Hepatitis C  Blood testing is recommended for:  Everyone born from [birth year] through 1966.  Anyone with known risk factors for hepatitis C. Sexually transmitted infections (STIs)  You should be screened for sexually transmitted infections (STIs) including gonorrhea and chlamydia if:  You are sexually active and are  younger than 40 years of age.  You are older than 40 years of age and your health care provider tells you that you are at risk for this type of infection.  Your sexual activity has changed since you were last screened and you are at an increased risk for chlamydia or gonorrhea. Ask your health care provider if you are at risk.  If you do not have HIV, but are at risk, it may be recommended that you take a prescription medicine daily to prevent HIV infection. This is called pre-exposure prophylaxis (PrEP). You are considered at risk  if:  You are sexually active and do not regularly use condoms or know the HIV status of your partner(s).  You take drugs by injection.  You are sexually active with a partner who has HIV. Talk with your health care provider about whether you are at high risk of being infected with HIV. If you choose to begin PrEP, you should first be tested for HIV. You should then be tested every 3 months for as long as you are taking PrEP.  PREGNANCY   If you are premenopausal and you may become pregnant, ask your health care provider about preconception counseling.  If you may become pregnant, take 400 to 800 micrograms (mcg) of folic acid every day.  If you want to prevent pregnancy, talk to your health care provider about birth control (contraception). OSTEOPOROSIS AND MENOPAUSE   Osteoporosis is a disease in which the bones lose minerals and strength with aging. This can result in serious bone fractures. Your risk for osteoporosis can be identified using a bone density scan.  If you are 45 years of age or older, or if you are at risk for osteoporosis and fractures, ask your health care provider if you should be screened.  Ask your health care provider whether you should take a calcium or vitamin D supplement to lower your risk for osteoporosis.  Menopause may have certain physical symptoms and risks.  Hormone replacement therapy may reduce some of these symptoms and  risks. Talk to your health care provider about whether hormone replacement therapy is right for you.  HOME CARE INSTRUCTIONS   Schedule regular health, dental, and eye exams.  Stay current with your immunizations.   Do not use any tobacco products including cigarettes, chewing tobacco, or electronic cigarettes.  If you are pregnant, do not drink alcohol.  If you are breastfeeding, limit how much and how often you drink alcohol.  Limit alcohol intake to no more than 1 drink per day for nonpregnant women. One drink equals 12 ounces of beer, 5 ounces of wine, or 1 ounces of hard liquor.  Do not use street drugs.  Do not share needles.  Ask your health care provider for help if you need support or information about quitting drugs.  Tell your health care provider if you often feel depressed.  Tell your health care provider if you have ever been abused or do not feel safe at home.   This information is not intended to replace advice given to you by your health care provider. Make sure you discuss any questions you have with your health care provider.   Document Released: 09/29/2010 Document Revised: 04/06/2014 Document Reviewed: 02/15/2013 Elsevier Interactive Patient Education [employer].

## 2015-01-02 NOTE — Progress Notes (Signed)
Pre visit review using our clinic review tool, if applicable. No additional management support is needed unless otherwise documented below in the visit note. 

## 2015-01-02 NOTE — Progress Notes (Signed)
Subjective:    Patient ID: Jennifer Beasley, female    DOB: [birth year], 39 y.o.   MRN: [id number]  HPI She is here for an annual exam.  She feels well and denies any changes in her health since she was here last.  She is due for blood work and prescriptions.  She has noticed slight dryness in her hair and wonders if her thyroid level is off.  She denies any fatigue or change in bowels.   Depression:  She started the sertaline for postpartum depression.  She also felt slightly depressed/mood labile near her menses.  The sertraline helps keep her well balanced.  She denies depression and anxiety and is happy with her current dose.   Medications and allergies reviewed with patient and no updates were needed  Patient Active Problem List   Diagnosis Date Noted  . Hypothyroid 11/23/2012  . Postpartum depression   . 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 Cavus deformity of foot, acquired   . CHONDROMALACIA PATELLA, LEFT   . Unspecified hypothyroidism     Past Surgical History  Procedure Laterality Date  . No past surgeries      Social History   Social History  . Marital Status: Married    Spouse Name: N/A  . Number of Children: N/A  . Years of Education: N/A   Social History Main Topics  . Smoking status: Former Smoker    Quit date: 04/18/1998  . Smokeless tobacco: Never Used  . Alcohol Use: Yes  . Drug Use: No  . Sexual Activity: Not Asked   Other Topics Concern  . None   Social History Narrative    Review of Systems  Constitutional: Negative for fever, chills, appetite change and fatigue.  HENT: Negative for congestion, ear pain, hearing loss and sore throat.   Eyes: Negative for visual disturbance.  Respiratory: Negative for cough, shortness of breath and wheezing.   Cardiovascular: Negative for chest pain, palpitations and leg swelling.  Gastrointestinal:  Negative for nausea, abdominal pain, diarrhea, constipation and blood in stool.  Genitourinary: Negative for dysuria and hematuria.  Musculoskeletal: Negative for myalgias and arthralgias.  Skin: Negative for rash.  Neurological: Negative for dizziness, light-headedness and headaches.  Psychiatric/Behavioral: Negative for dysphoric mood. The patient is not nervous/anxious.        Objective:   Filed Vitals:   01/02/15 0956  BP: 108/70  Pulse: 66  Temp: 98.1 F (36.7 C)  Resp: 13   Filed Weights   01/02/15 0956  Weight: 180 lb (81.647 kg)   Body mass index is 27.38 kg/(m^2).   Physical Exam  Constitutional: She is oriented to person, place, and time. She appears well-developed and well-nourished. No distress.  HENT:  Head: Normocephalic and atraumatic.  Right Ear: External ear normal.  Left Ear: External ear normal.  Mouth/Throat: Oropharynx is clear and moist.  Eyes: Conjunctivae and EOM are normal.  Neck: Neck supple. No tracheal deviation present. No thyromegaly present.  Cardiovascular: Normal rate, regular rhythm and normal heart sounds.   No murmur heard. Pulmonary/Chest: Effort normal and breath sounds normal. No respiratory distress. She has no wheezes.  Abdominal: Soft. She exhibits no distension and no mass. There is no tenderness.  Genitourinary:  Deferred to gyn  Musculoskeletal: She exhibits no edema.  Lymphadenopathy:    She has no cervical adenopathy.  Neurological: She is alert and oriented to  person, place, and time.  Skin: No rash noted.  Slight change in hair - more dry  Psychiatric: She has a normal mood and affect. Her behavior is normal.          Assessment & Plan:   Physical examination: Screening blood work ordered HM, mammo and pap up to date Vaccines up to date, already had flu vaccine Exercise  - she was not exercising regularly, but just restarted Working on weight loss Healthy diet No concern for substance abuse  Skin cancer  prevention discussed  meds renewed Follow up annually

## 2016-01-06 ENCOUNTER — Ambulatory Visit: Payer: PRIVATE HEALTH INSURANCE | Admitting: Family Medicine

## 2016-01-07 ENCOUNTER — Encounter: Payer: Self-pay | Admitting: Nurse Practitioner

## 2016-01-07 ENCOUNTER — Ambulatory Visit (HOSPITAL_COMMUNITY)
Admission: RE | Admit: 2016-01-07 | Discharge: 2016-01-07 | Disposition: A | Discharge status: Home / Self Care | Payer: PRIVATE HEALTH INSURANCE | Source: Ambulatory Visit | Attending: Nurse Practitioner | Admitting: Nurse Practitioner

## 2016-01-07 ENCOUNTER — Ambulatory Visit (INDEPENDENT_AMBULATORY_CARE_PROVIDER_SITE_OTHER): Payer: PRIVATE HEALTH INSURANCE | Admitting: Nurse Practitioner

## 2016-01-07 ENCOUNTER — Other Ambulatory Visit (INDEPENDENT_AMBULATORY_CARE_PROVIDER_SITE_OTHER): Payer: PRIVATE HEALTH INSURANCE

## 2016-01-07 ENCOUNTER — Ambulatory Visit (INDEPENDENT_AMBULATORY_CARE_PROVIDER_SITE_OTHER)
Admission: RE | Admit: 2016-01-07 | Discharge: 2016-01-07 | Disposition: A | Discharge status: Home / Self Care | Payer: PRIVATE HEALTH INSURANCE | Source: Ambulatory Visit | Attending: Nurse Practitioner | Admitting: Nurse Practitioner

## 2016-01-07 VITALS — BP 128/86 | HR 86 | Ht 68.0 in | Wt 207.0 lb

## 2016-01-07 DIAGNOSIS — M25552 Pain in left hip: Secondary | ICD-10-CM

## 2016-01-07 DIAGNOSIS — E039 Hypothyroidism, unspecified: Secondary | ICD-10-CM

## 2016-01-07 DIAGNOSIS — Z0000 Encounter for general adult medical examination without abnormal findings: Secondary | ICD-10-CM | POA: Diagnosis not present

## 2016-01-07 DIAGNOSIS — R590 Localized enlarged lymph nodes: Secondary | ICD-10-CM

## 2016-01-07 DIAGNOSIS — F53 Postpartum depression: Secondary | ICD-10-CM

## 2016-01-07 DIAGNOSIS — Z0001 Encounter for general adult medical examination with abnormal findings: Secondary | ICD-10-CM

## 2016-01-07 DIAGNOSIS — Z23 Encounter for immunization: Secondary | ICD-10-CM | POA: Diagnosis not present

## 2016-01-07 DIAGNOSIS — N83201 Unspecified ovarian cyst, right side: Secondary | ICD-10-CM | POA: Insufficient documentation

## 2016-01-07 DIAGNOSIS — O99345 Other mental disorders complicating the puerperium: Secondary | ICD-10-CM

## 2016-01-07 LAB — TSH: TSH: 2.78 u[IU]/mL (ref 0.35–4.50)

## 2016-01-07 LAB — T4, FREE: FREE T4: 0.77 ng/dL (ref 0.60–1.60)

## 2016-01-07 MED ORDER — SERTRALINE HCL 50 MG PO TABS: 50.0000 mg | ORAL_TABLET | Freq: Every day | ORAL | 3 refills | Status: AC

## 2016-01-07 MED ORDER — SULFAMETHOXAZOLE-TRIMETHOPRIM 800-160 MG PO TABS: 1.0000 | ORAL_TABLET | Freq: Two times a day (BID) | ORAL | 0 refills | Status: AC

## 2016-01-07 MED ORDER — LEVOTHYROXINE SODIUM 75 MCG PO TABS: 75.0000 ug | ORAL_TABLET | Freq: Every day | ORAL | 3 refills | Status: AC

## 2016-01-07 NOTE — Progress Notes (Signed)
Normal results, see office note

## 2016-01-07 NOTE — Progress Notes (Addendum)
Subjective:    Patient ID: Jennifer Beasley, female    DOB: 22-Oct-1975, 40 y.o.   MRN: [id number]  Patient presents today for complete physical and evaluation of left pelvic, needs zoloft and synthroid refilled, and hip pain x 10days.  Pelvic Pain  The patient's primary symptoms include pelvic pain. The patient's pertinent negatives include no genital itching, genital lesions, genital odor, genital rash, missed menses, vaginal bleeding or vaginal discharge. This is a recurrent problem. Episode onset: ongoing for 10days, intermittent for last 2years. The problem occurs constantly. The problem has been waxing and waning. The pain is moderate. The problem affects the left side. She is not pregnant. Associated symptoms include joint pain. Pertinent negatives include no abdominal pain, anorexia, back pain, chills, constipation, diarrhea, discolored urine, dysuria, fever, flank pain, frequency, headaches, hematuria, joint swelling, nausea, painful intercourse, rash, sore throat, urgency or vomiting. Nothing aggravates the symptoms. She has tried nothing for the symptoms.  she denies any concerns about an STD. Denies any injury.  Immunizations: (TDAP, Hep C screen, Pneumovax, Influenza, zoster)  Health Maintenance  Topic Date Due  . Pap Smear  04/10/2016  . Tetanus Vaccine  01/06/2026  . Flu Shot  Completed  . HIV Screening  Completed   Diet:none Weight:  Wt Readings from Last 3 Encounters:  01/07/16 207 lb (93.9 kg)  01/02/15 180 lb (81.6 kg)  12/27/13 155 lb 4 oz (70.4 kg)   Exercise:none Fall Risk: Fall Risk  01/07/2016  Falls in the past year? No   Home Safety:home with husband Depression/Suicide: Depression screen [facility] 2/9 01/07/2016  Decreased Interest 0  Down, Depressed, Hopeless 0  PHQ - 2 Score 0   No flowsheet data found. Pap Smear (every 13yr for >21-29 without HPV, every 534yrfor >30-652yrith HPV):last done 05/2015 and normal per patient. Records requested Mammogram  (yearly, >45y15yrone 05/2015 due [place], normal per patient, records requested. Vision:up to date Dental: up to date Advanced Directive: Advanced Directives 01/07/2016  Does patient have an advance directive? Yes  Type of Advance Directive Living will  Copy of advanced directive(s) in chart? No - copy requested  Pre-existing out of facility DNR order (yellow form or pink MOST form) -   Sexual History (birth control, marital status, STD):sexually active with husnabd  Medications and allergies reviewed with patient and updated if appropriate.  Patient Active Problem List   Diagnosis Date Noted  . Hypothyroid 11/23/2012  . Postpartum depression   . 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   No current facility-administered medications on file prior to visit.     Past Medical History:  Diagnosis Date  . Cavus deformity of foot, acquired   . CHONDROMALACIA PATELLA, LEFT   . Unspecified hypothyroidism     Past Surgical History:  Procedure Laterality Date  . NO PAST SURGERIES      Social History   Social History  . Marital status: Married    Spouse name: N/A  . Number of children: 2  . Years of education: N/A   Social History Main Topics  . Smoking status: Former Smoker    Quit date: 04/18/1998  . Smokeless tobacco: Never Used  . Alcohol use Yes     Comment: socially  . Drug use: No  . Sexual activity: Yes    Birth control/ protection: IUD   Other Topics Concern  .  None   Social History Narrative  . None    Family History  Problem Relation Age of Onset  . Hyperlipidemia Mother   . Stroke Mother 30    A fib  . Atrial fibrillation Mother   . Diabetes Mother   . Hypothyroidism Mother   . Ovarian cancer Maternal Aunt   . Breast cancer Maternal Aunt 63  . Breast cancer Maternal Grandmother 66  . Prostate cancer  Maternal Grandfather   . Alcohol abuse Other     other relatives  . Diabetes Paternal Grandmother         Review of Systems  Constitutional: Negative for chills, fever and weight loss.  HENT: Negative for sore throat.   Respiratory: Negative for cough and shortness of breath.   Cardiovascular: Negative for chest pain, palpitations, leg swelling and PND.  Gastrointestinal: Negative for abdominal pain, anorexia, constipation, diarrhea, nausea and vomiting.  Genitourinary: Positive for pelvic pain. Negative for dysuria, flank pain, frequency, hematuria, missed menses, urgency and vaginal discharge.  Musculoskeletal: Positive for joint pain. Negative for back pain, falls and myalgias.  Skin: Negative for rash.  Neurological: Negative for dizziness, sensory change and headaches.  Psychiatric/Behavioral: Positive for depression. Negative for memory loss, substance abuse and suicidal ideas. The patient is not nervous/anxious and does not have insomnia.     Objective:   Vitals:   01/07/16 1100  BP: 128/86  Pulse: 86    Body mass index is 31.47 kg/m.   Physical Examination:  Physical Exam  Constitutional: She is oriented to person, place, and time and well-developed, well-nourished, and in no distress. No distress.  HENT:  Right Ear: External ear normal.  Left Ear: External ear normal.  Nose: Nose normal.  Mouth/Throat: Oropharynx is clear and moist. No oropharyngeal exudate.  Eyes: Conjunctivae and EOM are normal. Pupils are equal, round, and reactive to light. No scleral icterus.  Neck: Normal range of motion. Neck supple. No thyromegaly present.  Cardiovascular: Normal rate, normal heart sounds and intact distal pulses.   Pulmonary/Chest: Effort normal and breath sounds normal. She exhibits no tenderness.  Abdominal: Soft. Bowel sounds are normal. She exhibits no distension. There is no tenderness.  Musculoskeletal: Normal range of motion. She exhibits tenderness. She  exhibits no edema.       Right hip: Normal.       Left hip: She exhibits tenderness. She exhibits normal range of motion, normal strength, no bony tenderness, no swelling, no crepitus, no deformity and no laceration.       Lumbar back: Normal.       Legs: Fullness and tenderness of left inguinal lymph nodes.  Lymphadenopathy:    She has no cervical adenopathy.  Neurological: She is alert and oriented to person, place, and time. Gait normal.  Skin: Skin is warm and dry.  Psychiatric: Affect and judgment normal.    ASSESSMENT and PLAN:  Jennifer Beasley was seen today for annual exam.  Diagnoses and all orders for this visit:  Encounter for preventative adult health care exam with abnormal findings  Need for prophylactic vaccination with combined diphtheria-tetanus-pertussis (DTP) vaccine -     Tdap vaccine greater than or equal to 7yo IM  Pain in joint involving left pelvic region and thigh -     Cancel: US Pelvis Complete; Future -     DG HIP UNILAT WITH PELVIS 2-3 VIEWS LEFT; Future -     Cancel: US Pelvis Limited; Future -     US Pelvis Complete; Future -  US Transvaginal Non-OB; Future -     sulfamethoxazole-trimethoprim (BACTRIM DS,SEPTRA DS) 800-160 MG tablet; Take 1 tablet by mouth 2 (two) times daily. With food  Postpartum depression -     sertraline (ZOLOFT) 50 MG tablet; Take 1 tablet (50 mg total) by mouth daily.  Hypothyroidism, unspecified type -     TSH; Future -     T4, free; Future -     levothyroxine (SYNTHROID, LEVOTHROID) 75 MCG tablet; Take 1 tablet (75 mcg total) by mouth daily before breakfast.  Lymphadenopathy, inguinal -     sulfamethoxazole-trimethoprim (BACTRIM DS,SEPTRA DS) 800-160 MG tablet; Take 1 tablet by mouth 2 (two) times daily. With food   Recent Results (from the past 2160 hour(s))  TSH     Status: None   Collection Time: 01/07/16 11:50 AM  Result Value Ref Range   TSH 2.78 0.35 - 4.50 uIU/mL  T4, free     Status: None   Collection Time:  01/07/16 11:50 AM  Result Value Ref Range   Free T4 0.77 0.60 - 1.60 ng/dL    Comment: Specimens from patients who are undergoing biotin therapy and /or ingesting biotin supplements may contain high levels of biotin.  The higher biotin concentration in these specimens interferes with this Free T4 assay.  Specimens that contain high levels  of biotin may cause false high results for this Free T4 assay.  Please interpret results in light of the total clinical presentation of the patient.      No problem-specific Assessment & Plan notes found for this encounter.    Normal pelvic and trans vaginal [place]. Hip and pelvic X-ray indicates osteoarthritis  treated empirically with bactrim for inguinal lymphadenopathy. Consider CT or MRI of left Pelvic region to evaluate internal organs if pain persist.  Follow up: Return in about 1 year (around 01/06/2017) for CPE.  Wilfred Lacy, NP

## 2016-01-07 NOTE — Progress Notes (Signed)
Pre visit review using our clinic review tool, if applicable. No additional management support is needed unless otherwise documented below in the visit note. 

## 2016-01-07 NOTE — Patient Instructions (Addendum)
Normal pelvic and trans vaginal [place]. Hip and pelvic X-ray indicates osteoarthritis  treated empirically with bactrim for inguinal lymphadenopathy. Consider CT or MRI of left Pelvic region to evaluate internal organs if pain persist.

## 2016-01-09 ENCOUNTER — Ambulatory Visit (HOSPITAL_COMMUNITY): Payer: PRIVATE HEALTH INSURANCE

## 2016-01-09 ENCOUNTER — Encounter (HOSPITAL_COMMUNITY): Payer: Self-pay | Admitting: Emergency Medicine

## 2016-01-09 ENCOUNTER — Ambulatory Visit (HOSPITAL_COMMUNITY)
Admission: EM | Admit: 2016-01-09 | Discharge: 2016-01-09 | Disposition: A | Discharge status: Home / Self Care | Payer: PRIVATE HEALTH INSURANCE | Attending: Physician Assistant | Admitting: Physician Assistant

## 2016-01-09 DIAGNOSIS — R591 Generalized enlarged lymph nodes: Secondary | ICD-10-CM | POA: Diagnosis not present

## 2016-01-09 DIAGNOSIS — R590 Localized enlarged lymph nodes: Secondary | ICD-10-CM | POA: Diagnosis not present

## 2016-01-09 DIAGNOSIS — R102 Pelvic and perineal pain: Secondary | ICD-10-CM | POA: Diagnosis present

## 2016-01-09 DIAGNOSIS — Z79899 Other long term (current) drug therapy: Secondary | ICD-10-CM | POA: Insufficient documentation

## 2016-01-09 DIAGNOSIS — Z87891 Personal history of nicotine dependence: Secondary | ICD-10-CM | POA: Insufficient documentation

## 2016-01-09 MED ORDER — CEPHALEXIN 500 MG PO CAPS: 500.0000 mg | ORAL_CAPSULE | Freq: Four times a day (QID) | ORAL | 0 refills | Status: AC

## 2016-01-09 NOTE — ED Provider Notes (Signed)
CSN: [id number]     Arrival date & time 01/09/16  1043 History   None    No chief complaint on file. Pt has a swollen area left lower abdomen, lower groin.  Pt having increasing pain.  Transabdominal ultrasound showed ovarian cyst on right side.  Area is painful with walking, painful with moving.  (Consider location/radiation/quality/duration/timing/severity/associated sxs/prior Treatment) The history is provided by the patient. No language interpreter was used.   (Pt is well known to me, EDNP)  Past Medical History:  Diagnosis Date  . Cavus deformity of foot, acquired   . CHONDROMALACIA PATELLA, LEFT   . Unspecified hypothyroidism    Past Surgical History:  Procedure Laterality Date  . NO PAST SURGERIES     Family History  Problem Relation Age of Onset  . Hyperlipidemia Mother   . Stroke Mother 7    A fib  . Atrial fibrillation Mother   . Diabetes Mother   . Hypothyroidism Mother   . Ovarian cancer Maternal Aunt   . Breast cancer Maternal Aunt 63  . Breast cancer Maternal Grandmother 56  . Prostate cancer Maternal Grandfather   . Alcohol abuse Other     other relatives  . Diabetes Paternal Grandmother    Social History  Substance Use Topics  . Smoking status: Former Smoker    Quit date: 04/18/1998  . Smokeless tobacco: Never Used  . Alcohol use Yes     Comment: socially   OB History    Gravida Para Term Preterm AB Living   2 2 2     2    SAB TAB Ectopic Multiple Live Births           1     Review of Systems  Allergies  Review of patient's allergies indicates no known allergies.  Home Medications   Prior to Admission medications   Medication Sig Start Date End Date Taking? Authorizing Provider  levothyroxine (SYNTHROID, LEVOTHROID) 75 MCG tablet Take 1 tablet (75 mcg total) by mouth daily before breakfast. 01/07/16   Flossie Buffy, NP  sertraline (ZOLOFT) 50 MG tablet Take 1 tablet (50 mg total) by mouth daily. 01/07/16   Flossie Buffy, NP   sulfamethoxazole-trimethoprim (BACTRIM DS,SEPTRA DS) 800-160 MG tablet Take 1 tablet by mouth 2 (two) times daily. With food 01/07/16   Flossie Buffy, NP   Meds Ordered and Administered this Visit  Medications - No data to display  BP 132/67 (BP Location: Right Arm)   Pulse 92   Temp 97.7 F (36.5 C) (Oral)   Resp 16   LMP 12/24/2015   SpO2 100%  No data found.   Physical Exam  Constitutional: She appears well-developed and well-nourished.  Abdominal:  Tender area left groin,  Does not feel lymphatic, possible hernia,   Musculoskeletal: Normal range of motion.  Neurological: She is alert.  Skin: Skin is warm.  Nursing note and vitals reviewed.   Urgent Care Course   Clinical Course   Pt has increasing left inguinal/lower abdominal pain with area of swelling left.  Possible tissue defect,   Procedures (including critical care time)  Labs Review Labs Reviewed - No data to display  Imaging Review US Transvaginal Non-ob  Result Date: 01/07/2016 CLINICAL DATA:  Left pelvic pain for 10 days EXAM: TRANSABDOMINAL AND TRANSVAGINAL ULTRASOUND OF PELVIS TECHNIQUE: Both transabdominal and transvaginal ultrasound examinations of the pelvis were performed. Transabdominal technique was performed for global imaging of the pelvis including uterus, ovaries, adnexal regions, and pelvic  cul-de-sac. It was necessary to proceed with endovaginal exam following the transabdominal exam to visualize the uterus and ovaries. COMPARISON:  CT 09/27/2012 FINDINGS: Uterus Measurements: 7.4 x 3.9 x 4.7 cm. No fibroids or other mass visualized. Endometrium Thickness: 3.4 mm P. Linear echogenicity present, consistent with IUD. Right ovary Measurements: 3.3 x 2.3 x 2.8 cm. Complex cystic area measuring 2.1 x 2 x 2.5 cm without internal flow. Left ovary Measurements: 2.7 x 2.2 x 3.5 cm. Normal appearance/no adnexal mass. Other findings Small free fluid in the cul-de-sac. IMPRESSION: 1. Normal ultrasound  appearance of the left ovary 2. 2.5 cm complicated appearing cystic area in the right ovary, possible hemorrhagic cyst. 3. Small amount of free fluid in the cul-de-sac. Electronically Signed   By: Donavan Foil M.D.   On: 01/07/2016 16:50   US Pelvis Complete  Result Date: 01/07/2016 CLINICAL DATA:  Left pelvic pain for 10 days EXAM: TRANSABDOMINAL AND TRANSVAGINAL ULTRASOUND OF PELVIS TECHNIQUE: Both transabdominal and transvaginal ultrasound examinations of the pelvis were performed. Transabdominal technique was performed for global imaging of the pelvis including uterus, ovaries, adnexal regions, and pelvic cul-de-sac. It was necessary to proceed with endovaginal exam following the transabdominal exam to visualize the uterus and ovaries. COMPARISON:  CT 09/27/2012 FINDINGS: Uterus Measurements: 7.4 x 3.9 x 4.7 cm. No fibroids or other mass visualized. Endometrium Thickness: 3.4 mm P. Linear echogenicity present, consistent with IUD. Right ovary Measurements: 3.3 x 2.3 x 2.8 cm. Complex cystic area measuring 2.1 x 2 x 2.5 cm without internal flow. Left ovary Measurements: 2.7 x 2.2 x 3.5 cm. Normal appearance/no adnexal mass. Other findings Small free fluid in the cul-de-sac. IMPRESSION: 1. Normal ultrasound appearance of the left ovary 2. 2.5 cm complicated appearing cystic area in the right ovary, possible hemorrhagic cyst. 3. Small amount of free fluid in the cul-de-sac. Electronically Signed   By: Donavan Foil M.D.   On: 01/07/2016 16:50   US Pelvis Limited  Result Date: 01/09/2016 CLINICAL DATA:  Palpable area in the left groin for 2 years. This has become painful in the last 2 weeks. EXAM: ULTRASOUND OF left GROIN SOFT TISSUES TECHNIQUE: Ultrasound examination of the groin soft tissues was performed in the area of clinical concern. COMPARISON:  None. FINDINGS: Small normal sized inguinal lymph nodes are noted. The largest measures 1.6 x 0.4 x 1.0 cm. No mass, fluid collection or vascular  abnormality. IMPRESSION: Small inguinal lymph nodes appear fairly symmetric bilaterally. No mass or fluid collection. Electronically Signed   By: Marijo Sanes M.D.   On: 01/09/2016 12:37     Visual Acuity Review  Right Eye Distance:   Left Eye Distance:   Bilateral Distance:    Right Eye Near:   Left Eye Near:    Bilateral Near:         MDM   1. Lymphadenopathy    Meds ordered this encounter  Medications  . cephALEXin (KEFLEX) 500 MG capsule    Sig: Take 1 capsule (500 mg total) by mouth 4 (four) times daily.    Dispense:  40 capsule    Refill:  0    Order Specific Question:   Supervising Provider    Answer:   Ihor Gully D Beatty, PA-C 01/09/16 1250

## 2016-01-10 ENCOUNTER — Encounter: Payer: Self-pay | Admitting: Nurse Practitioner

## 2016-01-13 ENCOUNTER — Encounter: Payer: Self-pay | Admitting: Nurse Practitioner

## 2016-01-14 ENCOUNTER — Other Ambulatory Visit: Payer: Self-pay | Admitting: Nurse Practitioner

## 2016-01-14 DIAGNOSIS — M25552 Pain in left hip: Secondary | ICD-10-CM

## 2016-01-14 NOTE — Telephone Encounter (Signed)
Pt called in and said that she is still in pain and would should she do?

## 2016-01-16 ENCOUNTER — Encounter (HOSPITAL_COMMUNITY): Payer: Self-pay

## 2016-01-16 ENCOUNTER — Emergency Department (HOSPITAL_COMMUNITY)
Admission: EM | Admit: 2016-01-16 | Discharge: 2016-01-16 | Disposition: A | Discharge status: Home / Self Care | Payer: PRIVATE HEALTH INSURANCE | Attending: Emergency Medicine | Admitting: Emergency Medicine

## 2016-01-16 ENCOUNTER — Emergency Department (HOSPITAL_COMMUNITY): Payer: PRIVATE HEALTH INSURANCE

## 2016-01-16 ENCOUNTER — Telehealth: Payer: Self-pay | Admitting: Nurse Practitioner

## 2016-01-16 DIAGNOSIS — E039 Hypothyroidism, unspecified: Secondary | ICD-10-CM | POA: Insufficient documentation

## 2016-01-16 DIAGNOSIS — R1032 Left lower quadrant pain: Secondary | ICD-10-CM | POA: Diagnosis present

## 2016-01-16 DIAGNOSIS — Z87891 Personal history of nicotine dependence: Secondary | ICD-10-CM | POA: Insufficient documentation

## 2016-01-16 LAB — CBC
HEMATOCRIT: 36.7 % (ref 36.0–46.0)
Hemoglobin: 12.4 g/dL (ref 12.0–15.0)
MCH: 30.2 pg (ref 26.0–34.0)
MCHC: 33.8 g/dL (ref 30.0–36.0)
MCV: 89.3 fL (ref 78.0–100.0)
PLATELETS: 239 10*3/uL (ref 150–400)
RBC: 4.11 MIL/uL (ref 3.87–5.11)
RDW: 12.8 % (ref 11.5–15.5)
WBC: 5.4 10*3/uL (ref 4.0–10.5)

## 2016-01-16 LAB — COMPREHENSIVE METABOLIC PANEL
ALBUMIN: 4.1 g/dL (ref 3.5–5.0)
ALK PHOS: 66 U/L (ref 38–126)
ALT: 14 U/L (ref 14–54)
AST: 20 U/L (ref 15–41)
Anion gap: 9 (ref 5–15)
BILIRUBIN TOTAL: 0.7 mg/dL (ref 0.3–1.2)
BUN: 12 mg/dL (ref 6–20)
CO2: 23 mmol/L (ref 22–32)
CREATININE: 0.75 mg/dL (ref 0.44–1.00)
Calcium: 9.1 mg/dL (ref 8.9–10.3)
Chloride: 104 mmol/L (ref 101–111)
GFR calc Af Amer: >60 mL/min (ref >60)
GLUCOSE: 84 mg/dL (ref 65–99)
POTASSIUM: 3.7 mmol/L (ref 3.5–5.1)
Sodium: 136 mmol/L (ref 135–145)
TOTAL PROTEIN: 7.5 g/dL (ref 6.5–8.1)

## 2016-01-16 LAB — URINALYSIS, ROUTINE W REFLEX MICROSCOPIC
BILIRUBIN URINE: NEGATIVE
GLUCOSE, UA: NEGATIVE mg/dL
Hgb urine dipstick: NEGATIVE
KETONES UR: NEGATIVE mg/dL
Leukocytes, UA: NEGATIVE
NITRITE: NEGATIVE
PH: 5 (ref 5.0–8.0)
PROTEIN: NEGATIVE mg/dL
Specific Gravity, Urine: 1.004 — ABNORMAL LOW (ref 1.005–1.030)

## 2016-01-16 LAB — LIPASE, BLOOD: Lipase: 26 U/L (ref 11–51)

## 2016-01-16 LAB — I-STAT BETA HCG BLOOD, ED (MC, WL, AP ONLY): I-stat hCG, quantitative: <5 m[IU]/mL (ref <5)

## 2016-01-16 MED ORDER — CYCLOBENZAPRINE HCL 10 MG PO TABS: 10.0000 mg | ORAL_TABLET | Freq: Every day | ORAL | 0 refills | Status: AC

## 2016-01-16 MED ORDER — IOPAMIDOL (ISOVUE-300) INJECTION 61%
INTRAVENOUS | Status: AC
  Administered 2016-01-16: 100 mL
  Filled 2016-01-16: qty 100

## 2016-01-16 MED ORDER — ONDANSETRON HCL 4 MG/2ML IJ SOLN
4.0000 mg | Freq: Once | INTRAMUSCULAR | Status: AC
  Administered 2016-01-16: 4 mg via INTRAVENOUS
  Filled 2016-01-16: qty 2

## 2016-01-16 NOTE — ED Notes (Signed)
Patient transported to CT 

## 2016-01-16 NOTE — ED Triage Notes (Signed)
Pt reports LLQ abd pain. She was diagnosed with a hemorrhagic cyst 2 weeks ago but did not have any abd pain until today. Pt reports pain radiates into the back. Denies vomiting or diarrhea, no urinary symptoms. Some nausea present.

## 2016-01-16 NOTE — Telephone Encounter (Signed)
[facility] called and insurance told them it has been denied and the recommend MRI of Hip or they can do a peer to peer.  [employer] Nurse phone [phone] Opt 1 ext [phone] Please advise

## 2016-01-16 NOTE — ED Provider Notes (Signed)
[facility] DEPT Provider Note   CSN: [id number] Arrival date & time: 01/16/16  1247     History   Chief Complaint Chief Complaint  Patient presents with  . Abdominal Pain    HPI Jennifer Beasley is a 40 y.o. female.  Patient is a 40 year old female with a history of hypothyroidism presenting today with left lower quadrant pain that has been worsening over the last 2 weeks. Pain is worse with bending the left leg and is sharp and occasionally radiates into the back. It does not seem to be affected by eating, urinating. She denies any vaginal bleeding or discharge. She was seen by her OB/GYN 9 days ago and had a pelvic transvaginal ultrasound which showed a hemorrhagic right ovarian cyst but no other acute findings. She also had an ultrasound done of her pelvis externally 7 days ago which showed mild bilateral inguinal lymphadenopathy. However over the last week symptoms continue to worsen. Now she is having constant nausea without vomiting or diarrhea. Bowel movements have been normal. No fever.  No recent viral illnesses or sore throat. Has been on Keflex for 1 week due to the lymphadenopathy without improvement. No recent travel. No prior abdominal surgeries.   The history is provided by the patient.    Past Medical History:  Diagnosis Date  . Cavus deformity of foot, acquired   . CHONDROMALACIA PATELLA, LEFT   . Unspecified hypothyroidism     Patient Active Problem List   Diagnosis Date Noted  . Hypothyroid 11/23/2012  . Postpartum depression   . 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 CAVUS DEFORMITY OF FOOT, ACQUIRED 09/11/2008    Past Surgical History:  Procedure Laterality Date  . NO PAST SURGERIES      OB History    Gravida Para Term Preterm AB Living   2 2 2     2    SAB TAB Ectopic Multiple Live Births           1       Home Medications    Prior to Admission medications   Medication Sig Start Date End Date Taking? Authorizing Provider    cephALEXin (KEFLEX) 500 MG capsule Take 1 capsule (500 mg total) by mouth 4 (four) times daily. 01/09/16   Fransico Meadow, PA-C  levothyroxine (SYNTHROID, LEVOTHROID) 75 MCG tablet Take 1 tablet (75 mcg total) by mouth daily before breakfast. 01/07/16   Flossie Buffy, NP  sertraline (ZOLOFT) 50 MG tablet Take 1 tablet (50 mg total) by mouth daily. 01/07/16   Flossie Buffy, NP  sulfamethoxazole-trimethoprim (BACTRIM DS,SEPTRA DS) 800-160 MG tablet Take 1 tablet by mouth 2 (two) times daily. With food 01/07/16   Flossie Buffy, NP    Family History Family History  Problem Relation Age of Onset  . Hyperlipidemia Mother   . Stroke Mother 17    A fib  . Atrial fibrillation Mother   . Diabetes Mother   . Hypothyroidism Mother   . Ovarian cancer Maternal Aunt   . Breast cancer Maternal Aunt 63  . Breast cancer Maternal Grandmother 12  . Prostate cancer Maternal Grandfather   . Alcohol abuse Other     other relatives  . Diabetes Paternal Grandmother     Social History Social History  Substance Use Topics  . Smoking status: Former Smoker    Quit date: 04/18/1998  . Smokeless tobacco: Never Used  . Alcohol use Yes     Comment: socially     Allergies  Review of patient's allergies indicates no known allergies.   Review of Systems Review of Systems  All other systems reviewed and are negative.    Physical Exam Updated Vital Signs BP 141/80 (BP Location: Right Arm)   Pulse 64   Temp 98.3 F (36.8 C) (Oral)   Resp 18   Ht 5' 8"  (1.727 m)   Wt 207 lb (93.9 kg)   LMP 12/24/2015   SpO2 100%   BMI 31.47 kg/m   Physical Exam  Constitutional: She is oriented to person, place, and time. She appears well-developed and well-nourished. No distress.  HENT:  Head: Normocephalic and atraumatic.  Mouth/Throat: Oropharynx is clear and moist.  Eyes: Conjunctivae and EOM are normal. Pupils are equal, round, and reactive to light.  Neck: Normal range of motion. Neck  supple.  Cardiovascular: Normal rate, regular rhythm and intact distal pulses.   No murmur heard. Pulmonary/Chest: Effort normal and breath sounds normal. No respiratory distress. She has no wheezes. She has no rales.  Abdominal: Soft. She exhibits no distension. There is tenderness in the left lower quadrant. There is guarding. There is no rebound.  Positive psoas sign.  Minimal tender lymph nodes in the left inguinal area  Musculoskeletal: Normal range of motion. She exhibits no edema or tenderness.  Neurological: She is alert and oriented to person, place, and time.  Skin: Skin is warm and dry. No rash noted. No erythema.  Psychiatric: She has a normal mood and affect. Her behavior is normal.  Nursing note and vitals reviewed.    ED Treatments / Results  Labs (all labs ordered are listed, but only abnormal results are displayed) Labs Reviewed  URINALYSIS, ROUTINE W REFLEX MICROSCOPIC (NOT AT [facility]) - Abnormal; Notable for the following:       Result Value   Specific Gravity, Urine 1.004 (*)    All other components within normal limits  LIPASE, BLOOD  COMPREHENSIVE METABOLIC PANEL  CBC  I-STAT BETA HCG BLOOD, ED ([facility], [facility], [facility] ONLY)    EKG  EKG Interpretation None       Radiology Ct Abdomen Pelvis W Contrast  Result Date: 01/16/2016 CLINICAL DATA:  Two week history of left lower quadrant abdominal pain, progressing EXAM: CT ABDOMEN AND PELVIS WITH CONTRAST TECHNIQUE: Multidetector CT imaging of the abdomen and pelvis was performed using the standard protocol following bolus administration of intravenous contrast. CONTRAST:  111m ISOVUE-300 IOPAMIDOL (ISOVUE-300) INJECTION 61% COMPARISON:  September 27, 2012 FINDINGS: Lower chest: On the initial CT slice, there is a 3 mm nodular opacity in the superior segment of the right lower lobe. There is minimal posterior basilar pleural thickening bilaterally. Lungs elsewhere clear. Hepatobiliary: No focal liver lesions are evident. Gallbladder  wall is not appreciably thickened. There is no biliary duct dilatation. Pancreas: There is no pancreatic mass or inflammatory focus. Spleen: No splenic lesions are evident. Adrenals/Urinary Tract: Adrenals appear unremarkable bilaterally. Kidneys bilaterally show no evidence of mass or hydronephrosis on either side. There is no renal or ureteral calculus on either side. Urinary bladder is midline with wall thickness within normal limits. Stomach/Bowel: There is no appreciable bowel wall or mesenteric thickening. There is no bowel obstruction. No free air or portal venous air. Vascular/Lymphatic: There is no abdominal aortic aneurysm. No vascular lesions are evident. There is no appreciable adenopathy in the abdomen or pelvis. Reproductive: Uterus is anteverted. There is an intrauterine device within the endometrium. There is no pelvic mass or pelvic fluid collection. Other: Appendix appears normal. There  is no ascites or abscess in the abdomen or pelvis. There is a minimal ventral hernia containing only fat. Musculoskeletal: There are no blastic or lytic bone lesions. There is no intramuscular or abdominal wall lesion. IMPRESSION: No bowel obstruction.  No abscess.  Appendix appears normal. No renal or ureteral calculus.  No hydronephrosis. 3 mm nodular opacity in the right lower lobe. No follow-up needed if patient is low-risk. Non-contrast chest CT can be considered in 12 months if patient is high-risk. This recommendation follows the consensus statement: Guidelines for Management of Incidental Pulmonary Nodules Detected on CT Images: From the Fleischner Society 2017; Radiology 2017; 284:228-243. Minimal ventral hernia containing only fat. Electronically Signed   By: Lowella Grip III M.D.   On: 01/16/2016 15:17    Procedures Procedures (including critical care time)  Medications Ordered in ED Medications  ondansetron (ZOFRAN) injection 4 mg (not administered)     Initial Impression / Assessment  and Plan / ED Course  I have reviewed the triage vital signs and the nursing notes.  Pertinent labs & imaging results that were available during my care of the patient were reviewed by me and considered in my medical decision making (see chart for details).  Clinical Course   Patient is a 40 year old healthy female presenting with left lower quadrant pain that has been progressive over the last 2 weeks. She has recently had pelvic and transvaginal ultrasound which showed a right-sided hemorrhagic cyst but no other acute findings. No signs of ovarian torsion and low suspicion for PID or STI as the cause of her symptoms. She has no urinary symptoms no history of kidney stones. Low suspicion for UTI or pyelonephritis. However patient does have a Sos Omni and concern for potential diverticulitis versus colitis versus mass versus viral process. CBC, CMP, lipase, UA, CT abdomen pelvis pending  3:41 PM Labs wnl.  CT without acute cause of pain.  May be MSK (currently no fever or exacerbation with food, urinating or defecating) Final Clinical Impressions(s) / ED Diagnoses   Final diagnoses:  Left lower quadrant pain    New Prescriptions New Prescriptions   CYCLOBENZAPRINE (FLEXERIL) 10 MG TABLET    Take 1 tablet (10 mg total) by mouth at bedtime.     Blanchie Dessert, MD 01/16/16 [phone]

## 2016-01-16 NOTE — Telephone Encounter (Signed)
Addendum done. Please give me the number to call if I need to do a peer to peer as well.  Thank you

## 2016-01-16 NOTE — Telephone Encounter (Signed)
I called Jennifer Beasley the [employer] nurse and what she thinks is their doctor thinks you are looking at the hip bone and you may be looking internal.  She suggests to either do the peer to peer or an addendum note to clarify what you are r/o or looking for and we can send the note back to them.  Please advise

## 2016-01-16 NOTE — Telephone Encounter (Signed)
Ok to change to MRI of left pelvic region without contrast. Thank you

## 2016-01-17 ENCOUNTER — Other Ambulatory Visit: Payer: PRIVATE HEALTH INSURANCE

## 2016-01-17 NOTE — Telephone Encounter (Signed)
Pt went to ER yesterday and had CT done. Called and left msg with pt to follow up with her to see if she wants an ortho referral or to make a follow up appt with Baldo Ash per Charlotte's request

## 2016-05-15 ENCOUNTER — Other Ambulatory Visit (INDEPENDENT_AMBULATORY_CARE_PROVIDER_SITE_OTHER): Payer: PRIVATE HEALTH INSURANCE

## 2016-05-15 ENCOUNTER — Encounter: Payer: Self-pay | Admitting: Nurse Practitioner

## 2016-05-15 DIAGNOSIS — E039 Hypothyroidism, unspecified: Secondary | ICD-10-CM

## 2016-05-15 LAB — TSH: TSH: 1.44 u[IU]/mL (ref 0.35–4.50)

## 2016-05-15 LAB — T3, FREE: T3, Free: 3.8 pg/mL (ref 2.3–4.2)

## 2018-03-09 IMAGING — US US PELVIS LIMITED
1 series · 14 of 19 positions shown · non-contrast
Comparison: None.

CLINICAL DATA: Palpable area in the left groin for 2 years. This
has become painful in the last 2 weeks.

EXAM:
ULTRASOUND OF left GROIN SOFT TISSUES
TECHNIQUE: Ultrasound examination of the groin soft tissues was performed in
the area of clinical concern.

[Series 1: us pelvis limited · 0.05mm/px · 14 of 19 slices shown]
[im 1/19]
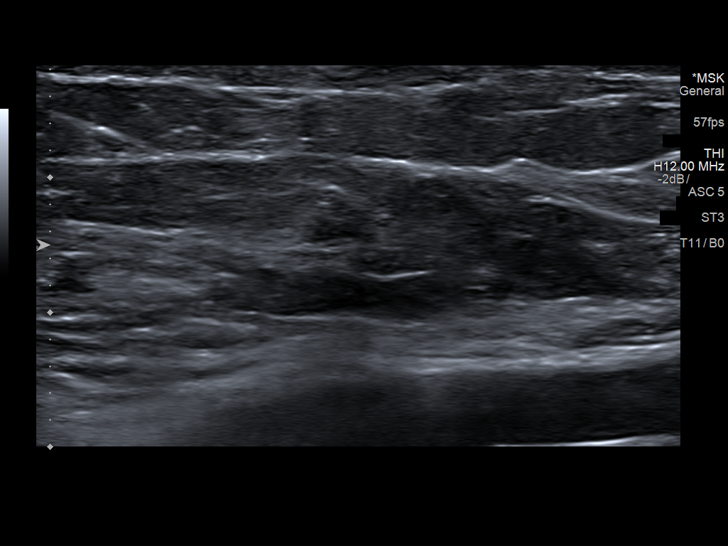
[im 3/19]
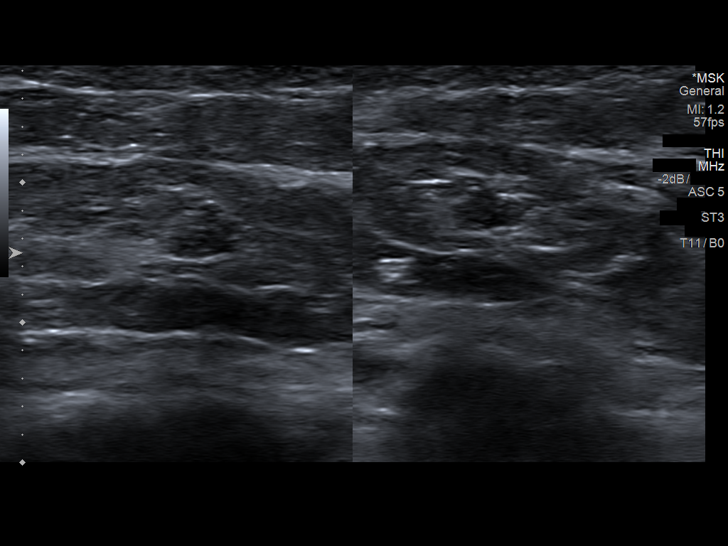
[im 4/19]
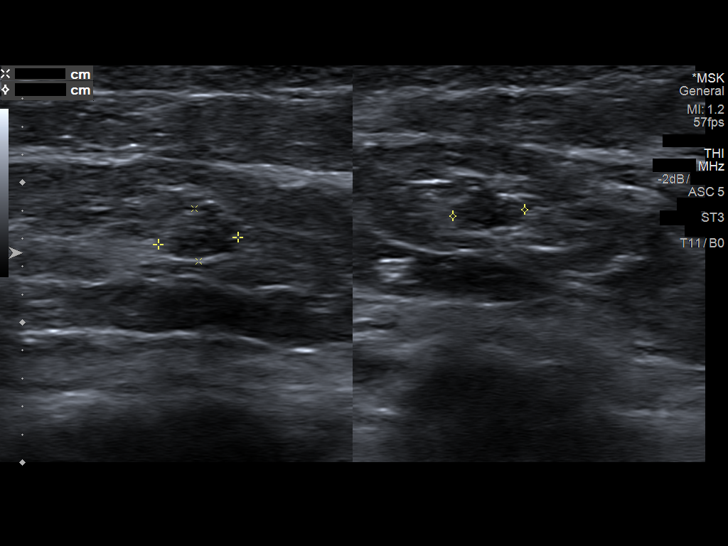
[im 5/19]
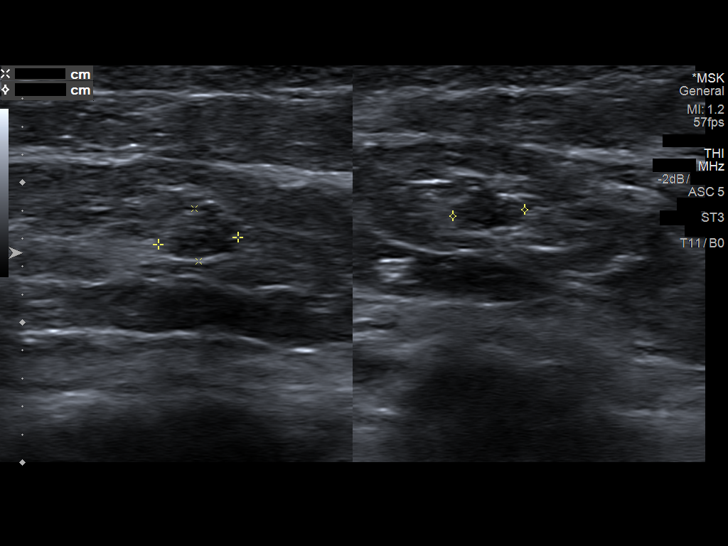
[im 7/19]
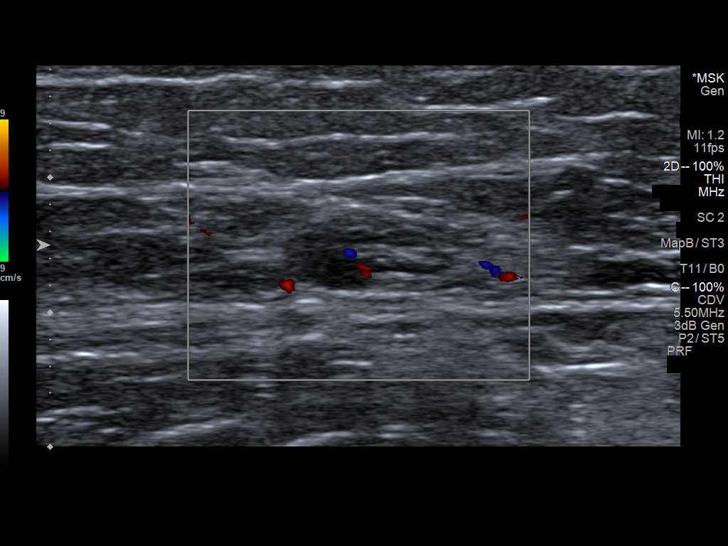
[im 8/19]
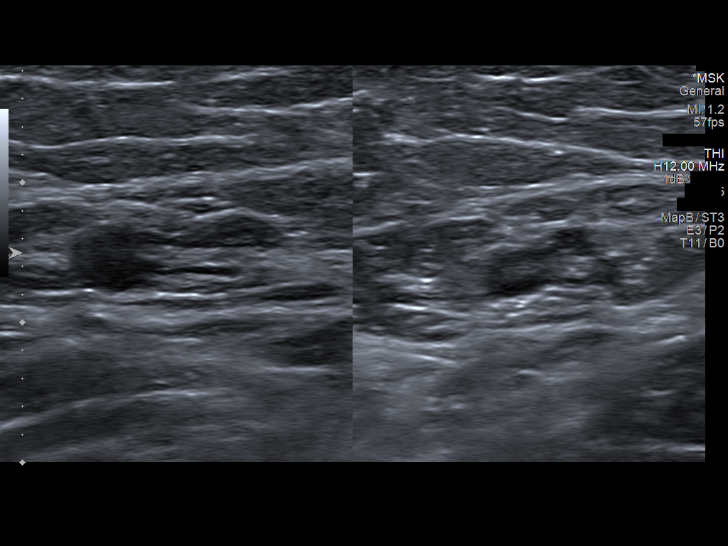
[im 9/19]
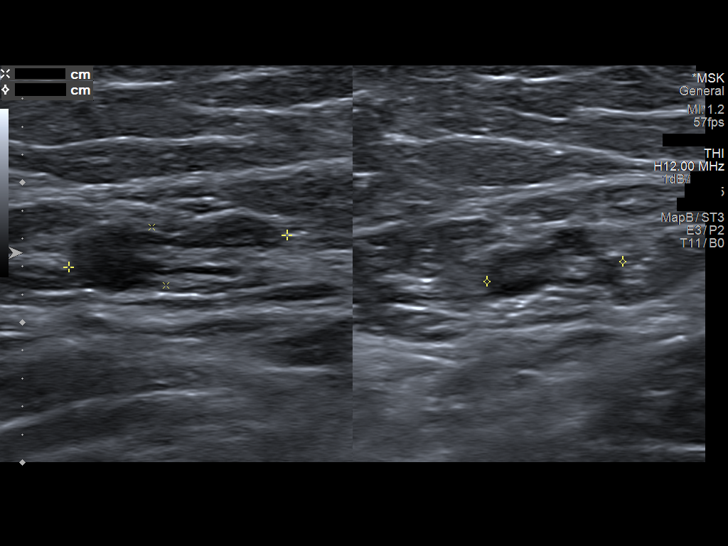
[im 11/19]
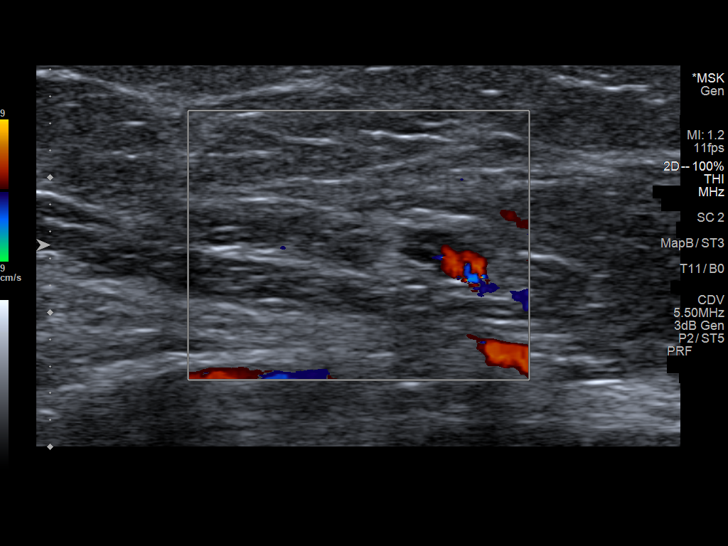
[im 12/19]
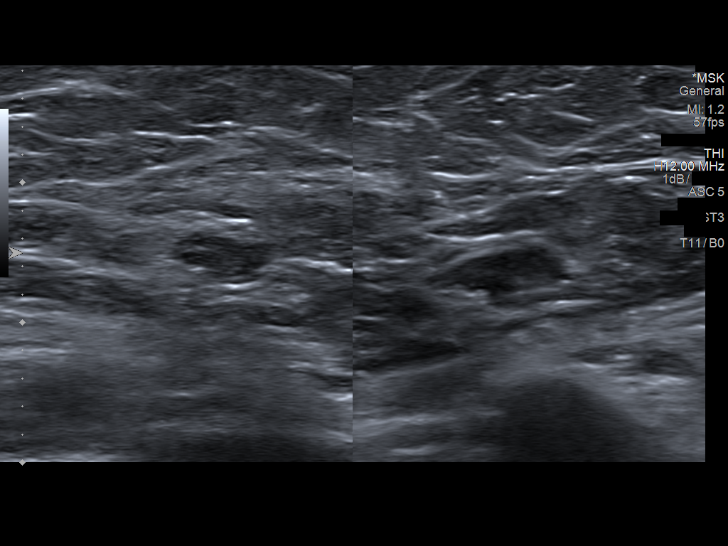
[im 13/19]
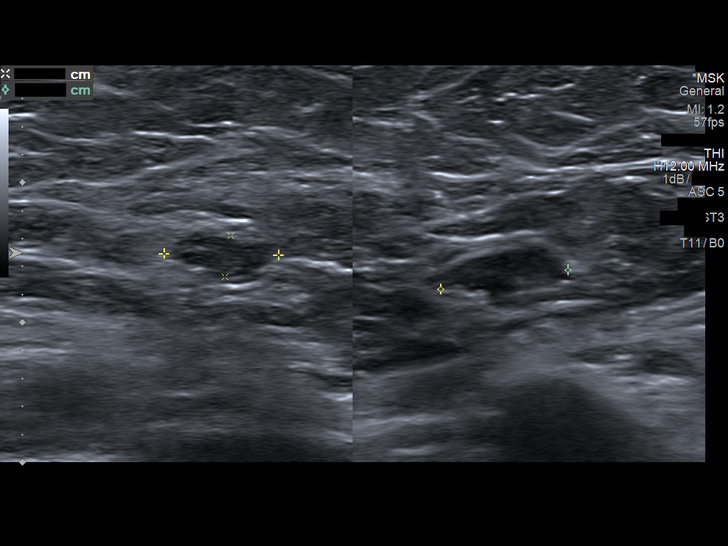
[im 15/19]
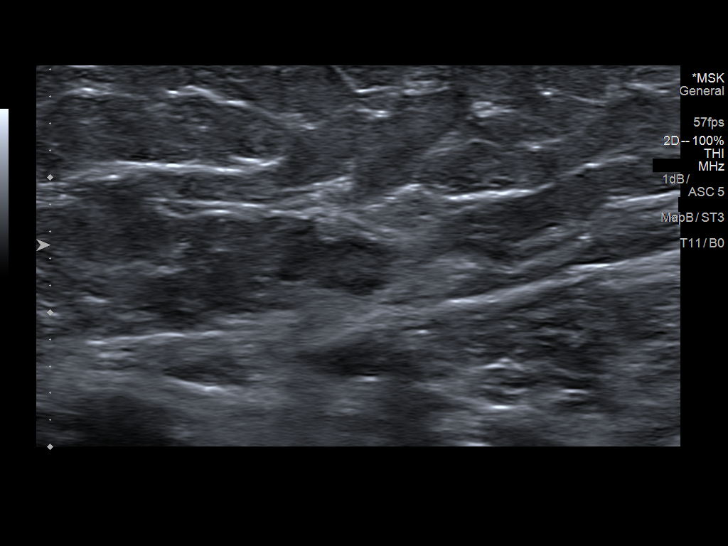
[im 16/19]
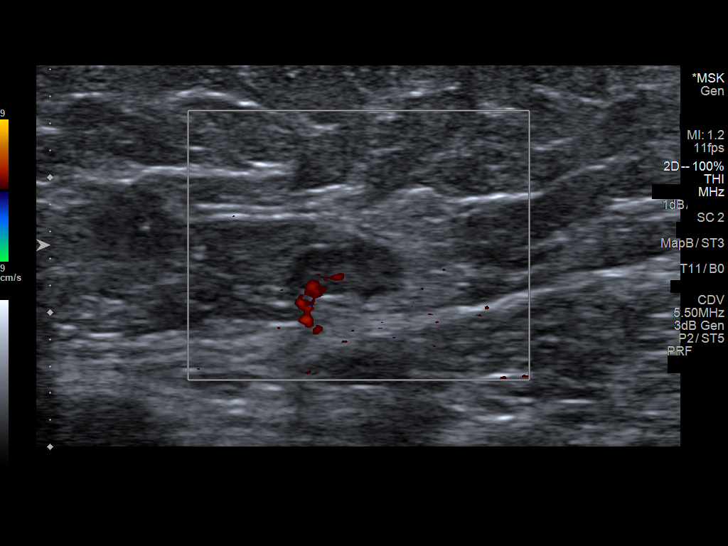
[im 17/19]
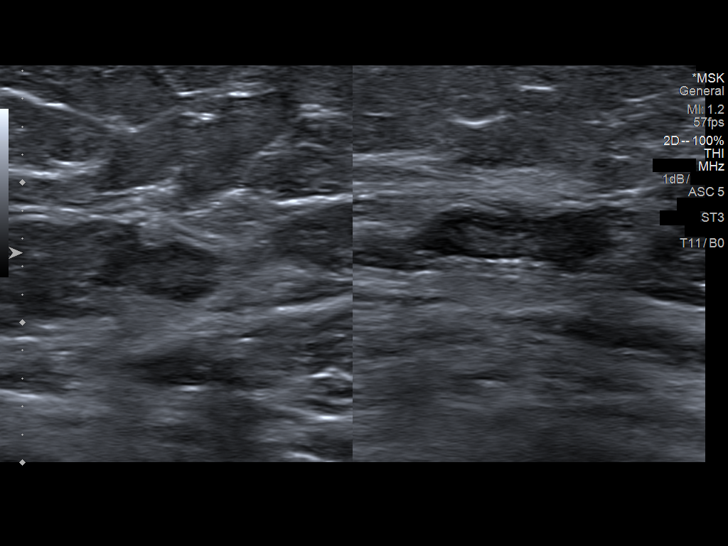
[im 19/19]
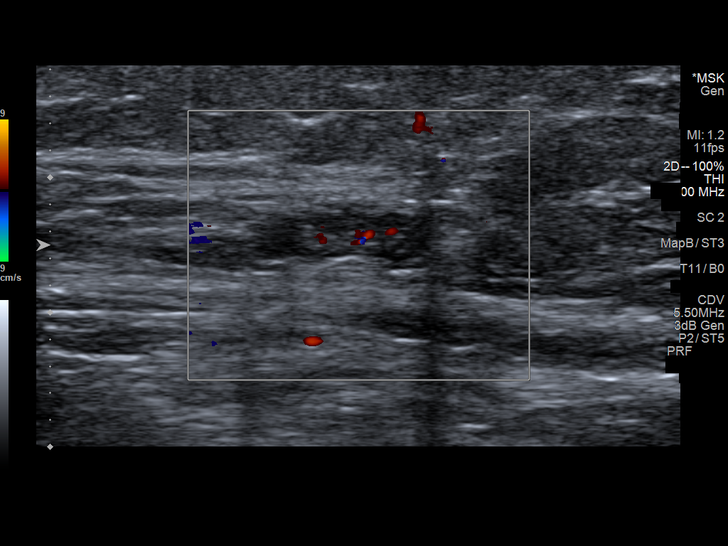

[14 of 19 positions shown; findings below may reference images not displayed]

FINDINGS: Small normal sized inguinal lymph nodes are noted. The largest
measures 1.6 x 0.4 x 1.0 cm. No mass, fluid collection or vascular
abnormality.
IMPRESSION: Small inguinal lymph nodes appear fairly symmetric bilaterally. No
mass or fluid collection.

## 2018-05-09 IMAGING — CT CT ABD-PELV W/ CM
2 of 5 series · 10 of 46 positions shown, 11 images · IV contrast (Iodine)
Comparison: September 27, 2012

CLINICAL DATA: Two week history of left lower quadrant abdominal
pain, progressing

EXAM:
CT ABDOMEN AND PELVIS WITH CONTRAST
TECHNIQUE: Multidetector CT imaging of the abdomen and pelvis was performed
using the standard protocol following bolus administration of
intravenous contrast.
CONTRAST:  100mL NUUG4V-XEE IOPAMIDOL (NUUG4V-XEE) INJECTION 61%

[Series 201: routine, idose (2) · axial · 0.78mm/px · z∈[+135,+540]mm · 7 of 103 slices shown, 8 images]
[im 11/103  soft-tissue]
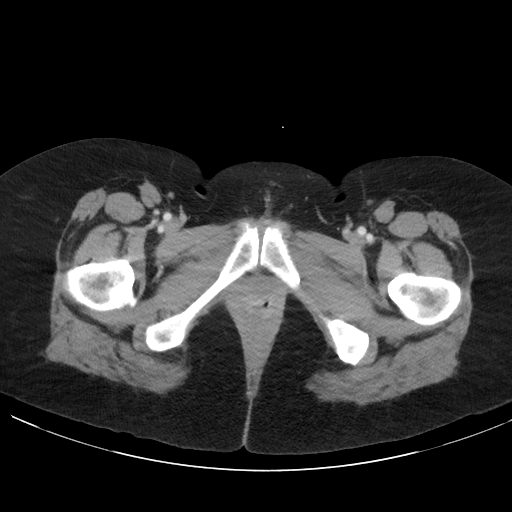
[im 11/103  bone]
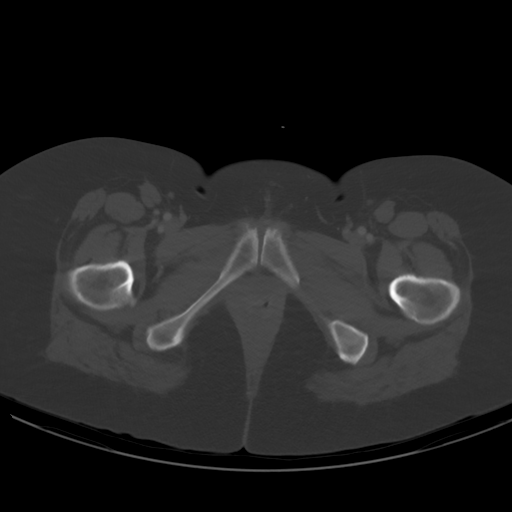
[im 26/103  soft-tissue]
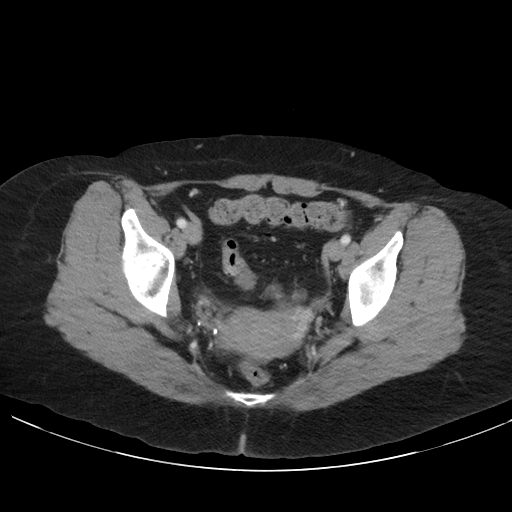
[im 36/103  soft-tissue]
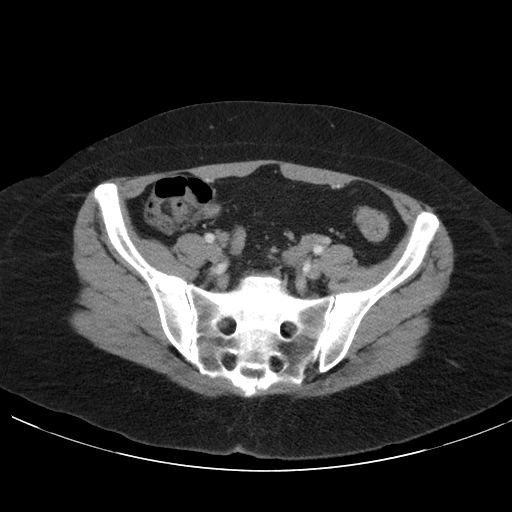
[im 52/103  soft-tissue]
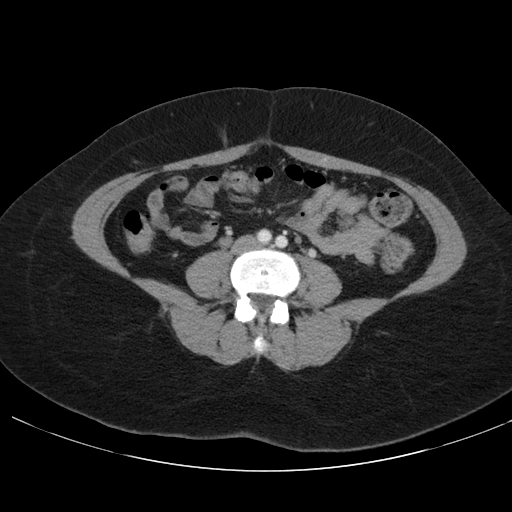
[im 67/103  soft-tissue]
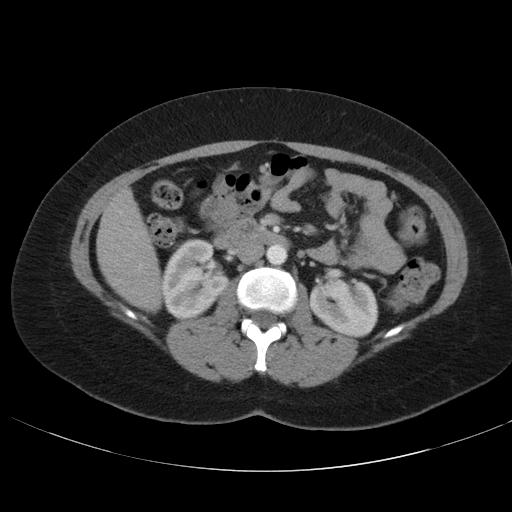
[im 77/103  soft-tissue]
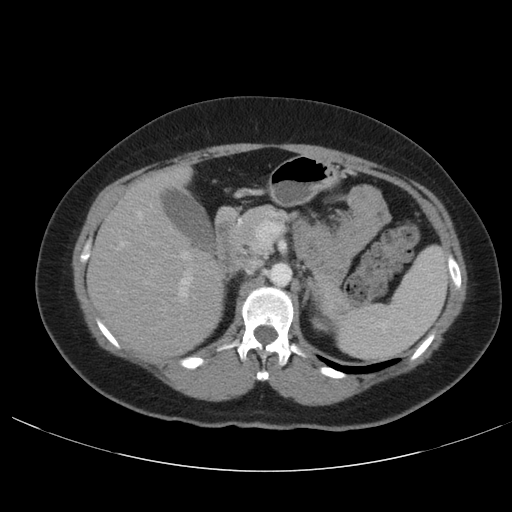
[im 92/103  soft-tissue]
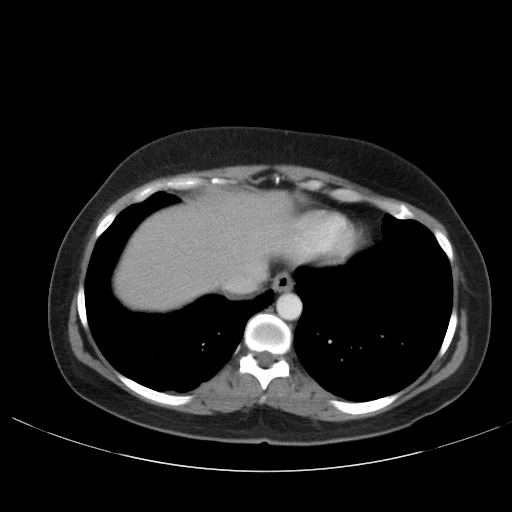

[Series 203: coronals, idose (2) · coronal · 0.45mm/px · 3 of 124 slices shown]
[im 42/124  soft-tissue]
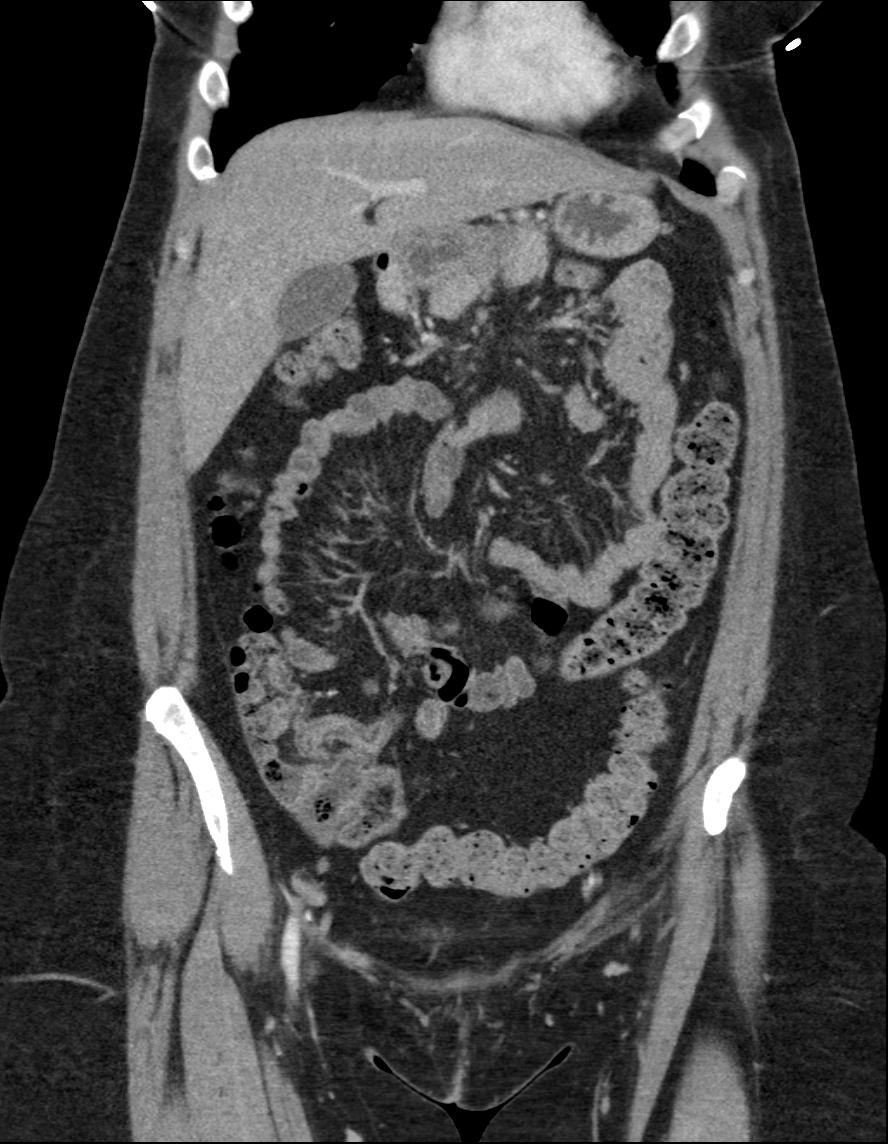
[im 55/124  soft-tissue]
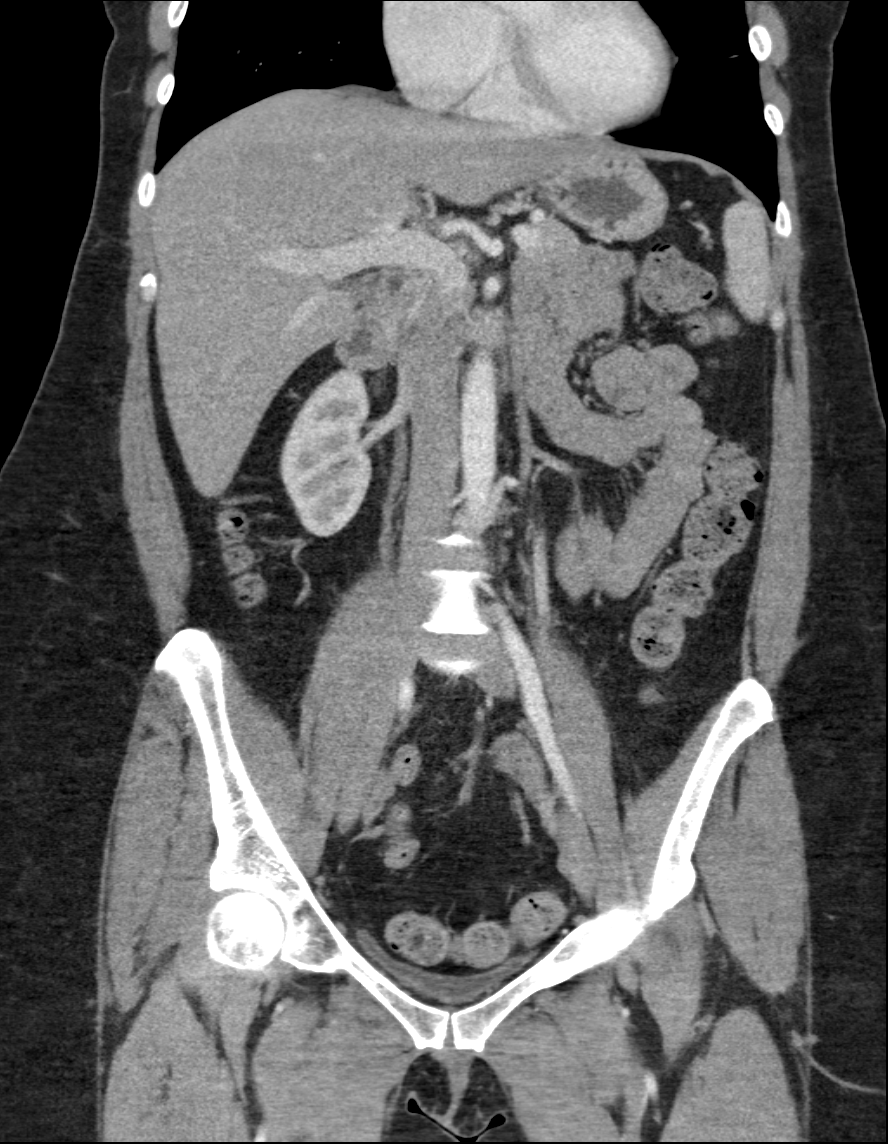
[im 69/124  soft-tissue]
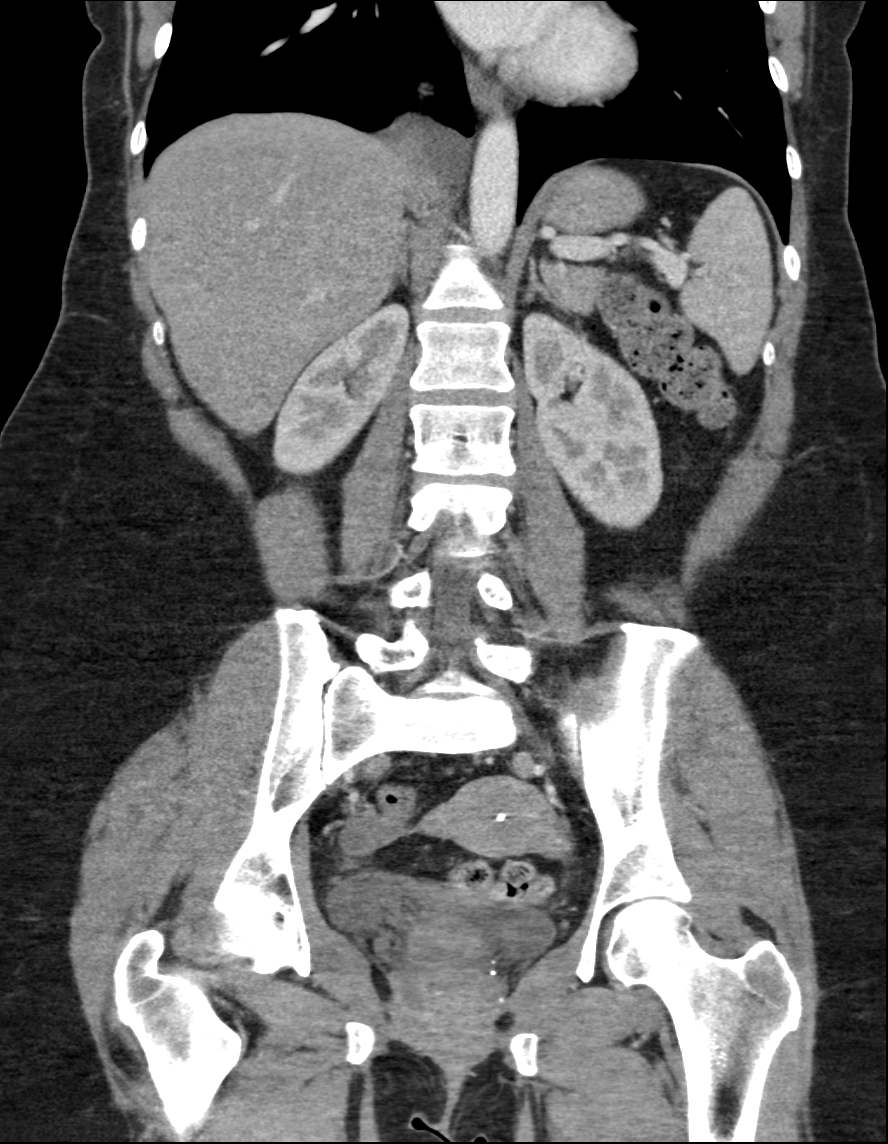

[10 of 46 positions shown; findings below may reference images not displayed]

FINDINGS: Lower chest: On the initial CT slice, there is a 3 mm nodular
opacity in the superior segment of the right lower lobe. There is
minimal posterior basilar pleural thickening bilaterally. Lungs
elsewhere clear.

Hepatobiliary: No focal liver lesions are evident. Gallbladder wall
is not appreciably thickened. There is no biliary duct dilatation.

Pancreas: There is no pancreatic mass or inflammatory focus.

Spleen: No splenic lesions are evident.

Adrenals/Urinary Tract: Adrenals appear unremarkable bilaterally.
Kidneys bilaterally show no evidence of mass or hydronephrosis on
either side. There is no renal or ureteral calculus on either side.
Urinary bladder is midline with wall thickness within normal limits.

Stomach/Bowel: There is no appreciable bowel wall or mesenteric
thickening. There is no bowel obstruction. No free air or portal
venous air.

Vascular/Lymphatic: There is no abdominal aortic aneurysm. No
vascular lesions are evident. There is no appreciable adenopathy in
the abdomen or pelvis.

Reproductive: Uterus is anteverted. There is an intrauterine device
within the endometrium. There is no pelvic mass or pelvic fluid
collection.

Other: Appendix appears normal. There is no ascites or abscess in
the abdomen or pelvis. There is a minimal ventral hernia containing
only fat.

Musculoskeletal: There are no blastic or lytic bone lesions. There
is no intramuscular or abdominal wall lesion.
IMPRESSION: No bowel obstruction.  No abscess.  Appendix appears normal.

No renal or ureteral calculus.  No hydronephrosis.

3 mm nodular opacity in the right lower lobe. No follow-up needed if
patient is low-risk. Non-contrast chest CT can be considered in 12
months if patient is high-risk. This recommendation follows the
consensus statement: Guidelines for Management of Incidental
Pulmonary Nodules Detected on CT Images: From the [HOSPITAL]

Minimal ventral hernia containing only fat.

## 2023-12-29 ENCOUNTER — Encounter: Payer: Self-pay | Admitting: Radiology

## 2023-12-29 ENCOUNTER — Ambulatory Visit: Payer: PRIVATE HEALTH INSURANCE | Admitting: Radiology

## 2023-12-29 VITALS — BP 104/68 | HR 105 | Ht 67.0 in | Wt 207.0 lb

## 2023-12-29 DIAGNOSIS — R6882 Decreased libido: Secondary | ICD-10-CM

## 2023-12-29 DIAGNOSIS — Z1501 Genetic susceptibility to malignant neoplasm of breast: Secondary | ICD-10-CM | POA: Diagnosis not present

## 2023-12-29 DIAGNOSIS — Z1509 Genetic susceptibility to other malignant neoplasm: Secondary | ICD-10-CM | POA: Diagnosis not present

## 2023-12-29 DIAGNOSIS — N951 Menopausal and female climacteric states: Secondary | ICD-10-CM | POA: Diagnosis not present

## 2023-12-29 DIAGNOSIS — N958 Other specified menopausal and perimenopausal disorders: Secondary | ICD-10-CM

## 2023-12-29 MED ORDER — IMVEXXY STARTER PACK 10 MCG VA INST: 1.0000 | VAGINAL_INSERT | Freq: Every evening | VAGINAL | 0 refills | Status: DC

## 2023-12-29 MED ORDER — ESTRADIOL 0.05 MG/24HR TD PTTW
1.0000 | MEDICATED_PATCH | TRANSDERMAL | 1 refills | Status: AC
Start: 2023-12-30 — End: ?

## 2023-12-29 MED ORDER — IMVEXXY MAINTENANCE PACK 10 MCG VA INST: 1.0000 | VAGINAL_INSERT | VAGINAL | 1 refills | Status: DC

## 2023-12-29 NOTE — Progress Notes (Signed)
   Jennifer Beasley Oct 21, 1975 [id number]   History: Postmenopausal 48 y.o. presents as a new patient for HRT consult. 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 Since hyst she has had hot flashes, low energy, low libido, vaginal dryness, mood changes and worsening depression. She is interested in HRT. Aware of the risks associated with breast cancer and the gene. She is also aware of the benefits HRT will have on her heart, bones and brain and feels the benefits are worth the risk. She has a mammogram and breast MRI yearly. Needs to schedule AEX. Works as an NP.    Gynecologic History Postmenopausal Last Pap: 2021. Results were: normal Last mammogram: 02/15/23. Results were: normal. Breast MRI normal 10/14/23 Last colonoscopy: FIT DNA stool test 03/25/21   Obstetric History OB History  Gravida Para Term Preterm AB Living  2 2 2   2   SAB IAB Ectopic Multiple Live Births      2    # Outcome Date GA Lbr Len/2nd Weight Sex Type Anes PTL Lv  2 Term 11/04/11 [redacted]w[redacted]d 09:32 / 00:20 7 lb 1.8 oz (3.225 kg) F Vag-Spont EPI  LIV  1 Term                The following portions of the patient's history were reviewed and updated as appropriate: allergies, current medications, past family history, past medical history, past social history, past surgical history, and problem list.  Review of Systems Pertinent items noted in HPI and remainder of comprehensive ROS otherwise negative.  Past medical history, past surgical history, family history and social history were all reviewed and documented in the EPIC chart.  Exam:  Vitals:   12/29/23 0845  BP: 104/68  Pulse: (!) 105  SpO2: 95%  Weight: 207 lb (93.9 kg)  Height: 5' 7 (1.702 m)   Body mass index is 32.42 kg/m.  Physical Exam Constitutional:      Appearance: Normal appearance.  Pulmonary:     Effort: Pulmonary effort is normal.  Neurological:     Mental Status: She is alert.  Psychiatric:        Mood and  Affect: Mood normal.        Thought Content: Thought content normal.        Judgment: Judgment normal.     Darice Hoit, CMA present for exam  Assessment/Plan:   1. Menopausal symptoms (Primary) Aware and well educated on risks and benefits. Will begin twice weekly patch. - estradiol (VIVELLE-DOT) 0.05 MG/24HR patch; Place 1 patch (0.05 mg total) onto the skin 2 (two) times a week.  Dispense: 24 patch; Refill: 1  2. Genitourinary syndrome of menopause - Estradiol Starter Pack (IMVEXXY STARTER PACK) 10 MCG INST; Place 1 tablet vaginally at bedtime.  Dispense: 18 each; Refill: 0  3. RAD51C gene mutation positive Continue mammogram and MRI yearly Breast exam with next visit  4. Low libido Risks and benefits discussed of supplementing testosterone. Open to starting once levels are back. Will contact with results.  - Testos,Total,Free and SHBG (Female)   Follow up in 3 months for med check and AEX  [place] B WHNP-BC, 9:21 AM 12/29/2023

## 2024-01-04 ENCOUNTER — Other Ambulatory Visit: Payer: Self-pay | Admitting: Radiology

## 2024-01-04 ENCOUNTER — Ambulatory Visit: Payer: Self-pay | Admitting: Radiology

## 2024-01-04 DIAGNOSIS — R6882 Decreased libido: Secondary | ICD-10-CM

## 2024-01-04 LAB — TESTOS,TOTAL,FREE AND SHBG (FEMALE)
Free Testosterone: 1.1 pg/mL (ref 0.1–6.4)
Sex Hormone Binding: 51 nmol/L (ref 17–124)
Testosterone, Total, LC-MS-MS: 11 ng/dL (ref 2–45)

## 2024-01-04 MED ORDER — TESTOSTERONE 20.25 MG/ACT (1.62%) TD GEL: 1.0000 | Freq: Every evening | TRANSDERMAL | 0 refills | Status: AC

## 2024-03-01 DIAGNOSIS — N958 Other specified menopausal and perimenopausal disorders: Secondary | ICD-10-CM

## 2024-03-01 MED ORDER — IMVEXXY MAINTENANCE PACK 10 MCG VA INST: 1.0000 | VAGINAL_INSERT | VAGINAL | 1 refills | Status: AC

## 2024-03-01 NOTE — Telephone Encounter (Signed)
 Medication refill request: invexxy maintenance pack Last visit:  12-29-23 Next AEX: 04-11-24 Last MMG (if hormonal medication request): 02-17-24 neg Refill authorized: rx sent to [facility] per patient request.

## 2024-04-11 ENCOUNTER — Ambulatory Visit: Payer: PRIVATE HEALTH INSURANCE | Admitting: Radiology
